# Patient Record
Sex: Female | Born: 1955
Health system: Southern US, Community
[De-identification: ages and names within clinical notes are randomized; demographics above are authoritative.]

## PROBLEM LIST (undated history)

## (undated) DIAGNOSIS — B019 Varicella without complication: Secondary | ICD-10-CM

## (undated) DIAGNOSIS — N390 Urinary tract infection, site not specified: Secondary | ICD-10-CM

## (undated) DIAGNOSIS — E785 Hyperlipidemia, unspecified: Secondary | ICD-10-CM

## (undated) DIAGNOSIS — R011 Cardiac murmur, unspecified: Secondary | ICD-10-CM

## (undated) DIAGNOSIS — R7611 Nonspecific reaction to tuberculin skin test without active tuberculosis: Secondary | ICD-10-CM

## (undated) DIAGNOSIS — I10 Essential (primary) hypertension: Secondary | ICD-10-CM

## (undated) DIAGNOSIS — K219 Gastro-esophageal reflux disease without esophagitis: Secondary | ICD-10-CM

## (undated) DIAGNOSIS — Z8614 Personal history of Methicillin resistant Staphylococcus aureus infection: Secondary | ICD-10-CM

## (undated) DIAGNOSIS — R32 Unspecified urinary incontinence: Secondary | ICD-10-CM

## (undated) DIAGNOSIS — T7840XA Allergy, unspecified, initial encounter: Secondary | ICD-10-CM

## (undated) DIAGNOSIS — E079 Disorder of thyroid, unspecified: Secondary | ICD-10-CM

## (undated) HISTORY — DX: Essential (primary) hypertension: I10

## (undated) HISTORY — DX: Disorder of thyroid, unspecified: E07.9

## (undated) HISTORY — DX: Varicella without complication: B01.9

## (undated) HISTORY — DX: Unspecified urinary incontinence: R32

## (undated) HISTORY — PX: TONSILLECTOMY: SUR1361

## (undated) HISTORY — PX: POLYPECTOMY: SHX149

## (undated) HISTORY — DX: Nonspecific reaction to tuberculin skin test without active tuberculosis: R76.11

## (undated) HISTORY — DX: Cardiac murmur, unspecified: R01.1

## (undated) HISTORY — DX: Allergy, unspecified, initial encounter: T78.40XA

## (undated) HISTORY — DX: Personal history of Methicillin resistant Staphylococcus aureus infection: Z86.14

## (undated) HISTORY — DX: Urinary tract infection, site not specified: N39.0

## (undated) HISTORY — DX: Gastro-esophageal reflux disease without esophagitis: K21.9

## (undated) HISTORY — DX: Hyperlipidemia, unspecified: E78.5

## (undated) HISTORY — PX: OTHER SURGICAL HISTORY: SHX169

---
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
## 2004-01-30 HISTORY — PX: COLONOSCOPY: SHX174

## 2004-01-30 LAB — HM COLONOSCOPY: HM Colonoscopy: NEGATIVE

## 2006-01-29 HISTORY — PX: MOLE REMOVAL: SHX2046

## 2011-01-31 ENCOUNTER — Telehealth: Payer: Self-pay | Admitting: Family Medicine

## 2011-01-31 NOTE — Telephone Encounter (Signed)
Pt informed she should check with her provider in [place] for refills.  She voiced her understanding

## 2011-01-31 NOTE — Telephone Encounter (Signed)
Pt is set up to est 03/12/11 but will be out of her Synthroid before then. Pt requesting a refill to get her to her appt. Please contact pt   [facility] [phone]

## 2011-03-12 ENCOUNTER — Encounter: Payer: Self-pay | Admitting: Family Medicine

## 2011-03-12 ENCOUNTER — Ambulatory Visit (INDEPENDENT_AMBULATORY_CARE_PROVIDER_SITE_OTHER): Payer: 59 | Admitting: Family Medicine

## 2011-03-12 VITALS — BP 138/84 | HR 80 | Temp 97.2°F | Resp 12 | Ht 60.0 in | Wt 143.0 lb

## 2011-03-12 DIAGNOSIS — Q210 Ventricular septal defect: Secondary | ICD-10-CM

## 2011-03-12 DIAGNOSIS — R7611 Nonspecific reaction to tuberculin skin test without active tuberculosis: Secondary | ICD-10-CM | POA: Insufficient documentation

## 2011-03-12 DIAGNOSIS — E039 Hypothyroidism, unspecified: Secondary | ICD-10-CM | POA: Insufficient documentation

## 2011-03-12 DIAGNOSIS — I10 Essential (primary) hypertension: Secondary | ICD-10-CM | POA: Insufficient documentation

## 2011-03-12 NOTE — Progress Notes (Signed)
  Subjective:    Patient ID: Brianna Jackson, female    DOB: 29-Aug-1955, 56 y.o.   MRN: [id number]  HPI  New to establish care. The patient recently moved here from [facility]. She is involved in nursing management. Past medical history reviewed. Positive PPD in childhood. Treated with INH. History of hypothyroidism treated with levothyroxine 100 mcg daily. Needs repeat lab work.  History of ventricular septal defect. Most recent echo 2010. Old records pending. Patient had episode of acute chest pain last fall and had nuclear stress test which was unremarkable. She has hypertension treated with lisinopril 20 mg daily. History of mild hyperlipidemia. Question of gluten sensitivity but no definitive diagnosis of celiac disease. Surgical history reviewed as outlined elsewhere  Patient is divorced. Never smoked. No alcohol use.  Last Pap smear 2011.  Colonoscopy 2006. Tetanus 2011  Past Medical History  Diagnosis Date  . Chicken pox   . Hypertension   . Hyperlipidemia   . Urine incontinence   . UTI (urinary tract infection)   . Heart murmur     VSD, ECHO 2010  . Thyroid disease     hypothyroid  . Positive TB test     treated with INH childhood  . History of MRSA infection 2010, 2012   Past Surgical History  Procedure Date  . Tonsillectomy   . Mrsa 2010, 2012    absess  . Strabismus surgery 1964    reports that she has never smoked. She does not have any smokeless tobacco history on file. Her alcohol and drug histories not on file. family history includes Alcohol abuse in her father; Cancer in her mother; and Cirrhosis in her father. Allergies  Allergen Reactions  . Wheat     Gluten intollerance      Review of Systems  Constitutional: Negative for fever, activity change and fatigue.  HENT: Negative for hearing loss, ear pain, sore throat and trouble swallowing.   Eyes: Negative for visual disturbance.  Respiratory: Negative for cough and shortness of breath.   Cardiovascular:  Negative for chest pain and palpitations.  Gastrointestinal: Negative for abdominal pain, diarrhea, constipation and blood in stool.  Genitourinary: Negative for dysuria and hematuria.  Musculoskeletal: Negative for myalgias, back pain and arthralgias.  Skin: Negative for rash.  Neurological: Negative for dizziness, syncope and headaches.  Hematological: Negative for adenopathy.  Psychiatric/Behavioral: Negative for confusion and dysphoric mood.       Objective:   Physical Exam  Constitutional: She is oriented to person, place, and time. She appears well-developed and well-nourished.  HENT:  Mouth/Throat: Oropharynx is clear and moist.  Neck: Neck supple. No thyromegaly present.  Cardiovascular: Normal rate and regular rhythm.   Murmur heard.      Patient has 4/6 systolic murmur left sternal border and right upper sternal border  Pulmonary/Chest: Effort normal and breath sounds normal. No respiratory distress. She has no wheezes. She has no rales.  Musculoskeletal: She exhibits no edema.  Lymphadenopathy:    She has no cervical adenopathy.  Neurological: She is alert and oriented to person, place, and time.          Assessment & Plan:  #1 hypothyroidism. Recheck TSH #2 hypertension. Stable  #3 heart murmur with reported history of VSD. Get old records  #4 history of reported positive PPD

## 2011-03-12 NOTE — Patient Instructions (Signed)
Consider complete physical at some point later this year. 

## 2011-03-13 ENCOUNTER — Other Ambulatory Visit: Payer: Self-pay | Admitting: Family Medicine

## 2011-03-13 MED ORDER — LEVOTHYROXINE SODIUM 100 MCG PO TABS: 100.0000 ug | ORAL_TABLET | Freq: Every day | ORAL | Status: DC

## 2011-03-13 NOTE — Progress Notes (Signed)
Quick Note:  Pt informed on home VM, med renewed ______

## 2011-03-26 ENCOUNTER — Telehealth: Payer: Self-pay | Admitting: Family Medicine

## 2011-03-26 MED ORDER — LISINOPRIL 20 MG PO TABS: 20.0000 mg | ORAL_TABLET | Freq: Every day | ORAL | Status: DC

## 2011-03-26 NOTE — Telephone Encounter (Signed)
Pt needs Lisinopril refill. Uses [facility].

## 2011-04-20 ENCOUNTER — Other Ambulatory Visit: Payer: Self-pay | Admitting: Family Medicine

## 2011-04-20 DIAGNOSIS — Z1231 Encounter for screening mammogram for malignant neoplasm of breast: Secondary | ICD-10-CM

## 2011-05-03 ENCOUNTER — Ambulatory Visit
Admission: RE | Admit: 2011-05-03 | Discharge: 2011-05-03 | Disposition: A | Discharge status: Home / Self Care | Payer: 59 | Source: Ambulatory Visit | Attending: Family Medicine | Admitting: Family Medicine

## 2011-05-03 DIAGNOSIS — Z1231 Encounter for screening mammogram for malignant neoplasm of breast: Secondary | ICD-10-CM

## 2011-11-02 ENCOUNTER — Telehealth: Payer: Self-pay | Admitting: Family Medicine

## 2011-11-02 ENCOUNTER — Ambulatory Visit (INDEPENDENT_AMBULATORY_CARE_PROVIDER_SITE_OTHER): Payer: 59 | Admitting: Internal Medicine

## 2011-11-02 VITALS — BP 120/86 | HR 80 | Temp 98.0°F | Wt 145.0 lb

## 2011-11-02 DIAGNOSIS — J209 Acute bronchitis, unspecified: Secondary | ICD-10-CM

## 2011-11-02 MED ORDER — DOXYCYCLINE HYCLATE 100 MG PO TABS: 100.0000 mg | ORAL_TABLET | Freq: Two times a day (BID) | ORAL | Status: DC

## 2011-11-02 MED ORDER — HYDROCODONE-HOMATROPINE 5-1.5 MG/5ML PO SYRP: 5.0000 mL | ORAL_SOLUTION | Freq: Two times a day (BID) | ORAL | Status: DC | PRN

## 2011-11-02 MED ORDER — MOMETASONE FURO-FORMOTEROL FUM 200-5 MCG/ACT IN AERO: 2.0000 | INHALATION_SPRAY | Freq: Two times a day (BID) | RESPIRATORY_TRACT | Status: DC

## 2011-11-02 NOTE — Patient Instructions (Addendum)
Please call our office if your symptoms do not improve or gets worse.  

## 2011-11-02 NOTE — Assessment & Plan Note (Signed)
56 year old white female with signs and symptoms of mild acute bronchitis. Treat with doxycycline 100 mg twice daily. Sample of [facility] provided. Patient to use Hycodan twice daily as needed for cough.  Patient advised to call office if symptoms persist or worsen.

## 2011-11-02 NOTE — Telephone Encounter (Signed)
Caller: Hendel/Patient; Patient Name: Brianna Jackson; PCP: Evelena Peat [facility]); Best Callback Phone Number: [phone]; Call regarding: Sinus Symptoms; onset 10/28/11 with cough and congestion; had some nausea and wheezing; denies nasal drainage; c/o HA at times; afebrile; using Coricidin; All emergent sxs of Cough-Adult protocol r/o except "recurring episodes of cough or hoarseness within last 3 months"; disp see within 72 hrs; informed Tiajuana that an appt would need to be made prior to an antibiotic being prescribed; appt scheduled with Dr.Yoo 10/4 at 1600

## 2011-11-02 NOTE — Progress Notes (Signed)
  Subjective:    Patient ID: Brianna Jackson, female    DOB: 01/03/1956, 56 y.o.   MRN: [id number]  HPI  56 year old white female with history of hypertension and hypothyroidism complains of cough x 1 week. Her symptoms started last Sunday. She reports her grandchildren had similar symptoms/upper respiratory infection. Patient reports her cough is settling into her chest. She describes productive cough. 2 days ago she had slight chills but no documented fever. She also reports intermittent wheezing. She denies shortness of breath.  Review of Systems See HPI    Past Medical History  Diagnosis Date  . Chicken pox   . Hypertension   . Hyperlipidemia   . Urine incontinence   . UTI (urinary tract infection)   . Heart murmur     VSD, ECHO 2010  . Thyroid disease     hypothyroid  . Positive TB test     treated with INH childhood  . History of MRSA infection 2010, 2012    History   Social History  . Marital Status: Divorced    Spouse Name: N/A    Number of Children: N/A  . Years of Education: N/A   Occupational History  . Not on file.   Social History Main Topics  . Smoking status: Never Smoker   . Smokeless tobacco: Not on file  . Alcohol Use: Not on file  . Drug Use: Not on file  . Sexually Active: Not on file   Other Topics Concern  . Not on file   Social History Narrative  . No narrative on file    Past Surgical History  Procedure Date  . Tonsillectomy   . Mrsa 2010, 2012    absess  . Strabismus surgery 1964    Family History  Problem Relation Age of Onset  . Cancer Mother     lung cancer  . Alcohol abuse Father   . Cirrhosis Father     Allergies  Allergen Reactions  . Wheat     Gluten intollerance    Current Outpatient Prescriptions on File Prior to Visit  Medication Sig Dispense Refill  . calcium carbonate (OS-CAL) 600 MG TABS Take 600 mg by mouth 2 (two) times daily with a meal.      . levothyroxine (SYNTHROID, LEVOTHROID) 100 MCG tablet Take 1  tablet (100 mcg total) by mouth daily.  90 tablet  3  . lisinopril (PRINIVIL,ZESTRIL) 20 MG tablet Take 1 tablet (20 mg total) by mouth daily.  90 tablet  3  . vitamin B-12 (CYANOCOBALAMIN) 100 MCG tablet Take 50 mcg by mouth daily.        BP 120/86  Pulse 80  Temp 98 F (36.7 C) (Oral)  Wt 145 lb (65.772 kg)      Objective:   Physical Exam  Constitutional: She is oriented to person, place, and time. She appears well-developed and well-nourished.  HENT:  Head: Normocephalic and atraumatic.  Right Ear: External ear normal.  Left Ear: External ear normal.  Mouth/Throat: Oropharynx is clear and moist.  Neck: Neck supple.       No neck tenderness  Cardiovascular: Normal rate and regular rhythm.   Murmur heard. Pulmonary/Chest: Effort normal.       Slight coarse breath sounds at right base  Lymphadenopathy:    She has no cervical adenopathy.  Neurological: She is alert and oriented to person, place, and time.          Assessment & Plan:

## 2011-11-12 ENCOUNTER — Encounter: Payer: 59 | Admitting: Family Medicine

## 2011-11-23 ENCOUNTER — Encounter: Payer: Self-pay | Admitting: Family Medicine

## 2011-11-23 ENCOUNTER — Other Ambulatory Visit (HOSPITAL_COMMUNITY)
Admission: RE | Admit: 2011-11-23 | Discharge: 2011-11-23 | Disposition: A | Discharge status: Home / Self Care | Payer: 59 | Source: Ambulatory Visit | Attending: Family Medicine | Admitting: Family Medicine

## 2011-11-23 ENCOUNTER — Ambulatory Visit (INDEPENDENT_AMBULATORY_CARE_PROVIDER_SITE_OTHER): Payer: 59 | Admitting: Family Medicine

## 2011-11-23 VITALS — BP 120/82 | HR 80 | Temp 98.3°F | Resp 12 | Ht 60.25 in | Wt 148.0 lb

## 2011-11-23 DIAGNOSIS — E039 Hypothyroidism, unspecified: Secondary | ICD-10-CM

## 2011-11-23 DIAGNOSIS — Q210 Ventricular septal defect: Secondary | ICD-10-CM

## 2011-11-23 DIAGNOSIS — I10 Essential (primary) hypertension: Secondary | ICD-10-CM

## 2011-11-23 DIAGNOSIS — Z01419 Encounter for gynecological examination (general) (routine) without abnormal findings: Secondary | ICD-10-CM | POA: Insufficient documentation

## 2011-11-23 DIAGNOSIS — Z0000 Encounter for general adult medical examination without abnormal findings: Secondary | ICD-10-CM

## 2011-11-23 LAB — BASIC METABOLIC PANEL
CO2: 28 mEq/L (ref 19–32)
Chloride: 106 mEq/L (ref 96–112)
Creatinine, Ser: 0.9 mg/dL (ref 0.4–1.2)
Sodium: 141 mEq/L (ref 135–145)

## 2011-11-23 LAB — TSH: TSH: 1.04 u[IU]/mL (ref 0.35–5.50)

## 2011-11-23 LAB — LIPID PANEL
HDL: 47.1 mg/dL (ref >39.00)
Total CHOL/HDL Ratio: 4
Triglycerides: 90 mg/dL (ref 0.0–149.0)

## 2011-11-23 LAB — HEPATIC FUNCTION PANEL
AST: 30 U/L (ref 0–37)
Alkaline Phosphatase: 84 U/L (ref 39–117)
Total Bilirubin: 0.6 mg/dL (ref 0.3–1.2)

## 2011-11-23 LAB — CBC WITH DIFFERENTIAL/PLATELET
Eosinophils Relative: 2.8 % (ref 0.0–5.0)
Monocytes Relative: 7.2 % (ref 3.0–12.0)
Neutrophils Relative %: 63.4 % (ref 43.0–77.0)
Platelets: 251 10*3/uL (ref 150.0–400.0)
WBC: 6.5 10*3/uL (ref 4.5–10.5)

## 2011-11-23 NOTE — Progress Notes (Signed)
  Subjective:    Patient ID: Brianna Jackson, female    DOB: Jun 05, 1955, 56 y.o.   MRN: [id number]  HPI  Patient here for complete physical. Her chronic problems include history of VSD, hypertension, hypothyroidism, positive PPD which has been treated. Recent bronchial illness which has gradually improved. Still occasional dry cough. Blood pressures have been well controlled. She's due for repeat Pap smear -now almost 3 years. Last mammogram last April. Tetanus up-to-date. Plans flu vaccine through work. Colonoscopy up to date.  Increased job stress and very little exercise. Has had some weight gain in the past year.  Past Medical History  Diagnosis Date  . Chicken pox   . Hypertension   . Hyperlipidemia   . Urine incontinence   . UTI (urinary tract infection)   . Heart murmur     VSD, ECHO 2010  . Thyroid disease     hypothyroid  . Positive TB test     treated with INH childhood  . History of MRSA infection 2010, 2012   Past Surgical History  Procedure Date  . Tonsillectomy   . Mrsa 2010, 2012    absess  . Strabismus surgery 1964    reports that she has never smoked. She does not have any smokeless tobacco history on file. Her alcohol and drug histories not on file. family history includes Alcohol abuse in her father; Cancer in her mother; and Cirrhosis in her father. Allergies  Allergen Reactions  . Wheat     Gluten intollerance      Review of Systems  Constitutional: Negative for fever, activity change, appetite change and fatigue.  HENT: Negative for hearing loss, ear pain, sore throat and trouble swallowing.   Eyes: Negative for visual disturbance.  Respiratory: Positive for cough. Negative for shortness of breath.   Cardiovascular: Negative for chest pain and palpitations.  Gastrointestinal: Negative for abdominal pain, diarrhea, constipation and blood in stool.  Genitourinary: Negative for dysuria and hematuria.  Musculoskeletal: Negative for myalgias, back pain  and arthralgias.  Skin: Negative for rash.  Neurological: Negative for dizziness, syncope and headaches.  Hematological: Negative for adenopathy.  Psychiatric/Behavioral: Negative for confusion and dysphoric mood.       Objective:   Physical Exam  Constitutional: She is oriented to person, place, and time. She appears well-developed and well-nourished.  HENT:  Head: Normocephalic and atraumatic.  Eyes: EOM are normal. Pupils are equal, round, and reactive to light.  Neck: Normal range of motion. Neck supple. No thyromegaly present.  Cardiovascular: Normal rate, regular rhythm and normal heart sounds.   No murmur heard. Pulmonary/Chest: Breath sounds normal. No respiratory distress. She has no wheezes. She has no rales.  Abdominal: Soft. Bowel sounds are normal. She exhibits no distension and no mass. There is no tenderness. There is no rebound and no guarding.  Musculoskeletal: Normal range of motion. She exhibits no edema.  Lymphadenopathy:    She has no cervical adenopathy.  Neurological: She is alert and oriented to person, place, and time. She displays normal reflexes. No cranial nerve deficit.  Skin: No rash noted.  Psychiatric: She has a normal mood and affect. Her behavior is normal. Judgment and thought content normal.          Assessment & Plan:  Complete physical. Pap smear obtained. Continue yearly mammogram. Continue yearly flu vaccine. Colonoscopy up to date. Obtain screening lab work.

## 2011-11-30 NOTE — Progress Notes (Signed)
Quick Note:  Pt informed ______ 

## 2012-03-15 ENCOUNTER — Other Ambulatory Visit: Payer: Self-pay

## 2012-03-18 ENCOUNTER — Other Ambulatory Visit: Payer: Self-pay | Admitting: *Deleted

## 2012-03-18 MED ORDER — LEVOTHYROXINE SODIUM 100 MCG PO TABS: 100.0000 ug | ORAL_TABLET | Freq: Every day | ORAL | Status: DC

## 2012-04-02 ENCOUNTER — Other Ambulatory Visit: Payer: Self-pay

## 2012-04-02 DIAGNOSIS — Z1231 Encounter for screening mammogram for malignant neoplasm of breast: Secondary | ICD-10-CM

## 2012-04-14 ENCOUNTER — Other Ambulatory Visit: Payer: Self-pay | Admitting: *Deleted

## 2012-04-14 MED ORDER — LISINOPRIL 20 MG PO TABS: 20.0000 mg | ORAL_TABLET | Freq: Every day | ORAL | Status: DC

## 2012-05-05 ENCOUNTER — Ambulatory Visit
Admission: RE | Admit: 2012-05-05 | Discharge: 2012-05-05 | Disposition: A | Discharge status: Home / Self Care | Payer: 59 | Source: Ambulatory Visit

## 2012-05-05 DIAGNOSIS — Z1231 Encounter for screening mammogram for malignant neoplasm of breast: Secondary | ICD-10-CM

## 2012-06-18 ENCOUNTER — Ambulatory Visit (INDEPENDENT_AMBULATORY_CARE_PROVIDER_SITE_OTHER): Payer: 59 | Admitting: Family Medicine

## 2012-06-18 ENCOUNTER — Encounter: Payer: Self-pay | Admitting: Family Medicine

## 2012-06-18 VITALS — BP 110/82 | Temp 97.6°F

## 2012-06-18 DIAGNOSIS — R300 Dysuria: Secondary | ICD-10-CM

## 2012-06-18 DIAGNOSIS — N390 Urinary tract infection, site not specified: Secondary | ICD-10-CM

## 2012-06-18 LAB — POCT URINALYSIS DIPSTICK
Bilirubin, UA: NEGATIVE
Glucose, UA: NEGATIVE
Nitrite, UA: NEGATIVE
Spec Grav, UA: 1.02

## 2012-06-18 MED ORDER — MUPIROCIN 2 % EX OINT: TOPICAL_OINTMENT | Freq: Three times a day (TID) | CUTANEOUS | Status: DC

## 2012-06-18 MED ORDER — CIPROFLOXACIN HCL 500 MG PO TABS: 500.0000 mg | ORAL_TABLET | Freq: Two times a day (BID) | ORAL | Status: DC

## 2012-06-18 NOTE — Patient Instructions (Signed)
Urinary Tract Infection  Urinary tract infections (UTIs) can develop anywhere along your urinary tract. Your urinary tract is your body's drainage system for removing wastes and extra water. Your urinary tract includes two kidneys, two ureters, a bladder, and a urethra. Your kidneys are a pair of bean-shaped organs. Each kidney is about the size of your fist. They are located below your ribs, one on each side of your spine.  CAUSES  Infections are caused by microbes, which are microscopic organisms, including fungi, viruses, and bacteria. These organisms are so small that they can only be seen through a microscope. Bacteria are the microbes that most commonly cause UTIs.  SYMPTOMS   Symptoms of UTIs may vary by age and gender of the patient and by the location of the infection. Symptoms in young women typically include a frequent and intense urge to urinate and a painful, burning feeling in the bladder or urethra during urination. Older women and men are more likely to be tired, shaky, and weak and have muscle aches and abdominal pain. A fever may mean the infection is in your kidneys. Other symptoms of a kidney infection include pain in your back or sides below the ribs, nausea, and vomiting.  DIAGNOSIS  To diagnose a UTI, your caregiver will ask you about your symptoms. Your caregiver also will ask to provide a urine sample. The urine sample will be tested for bacteria and white blood cells. White blood cells are made by your body to help fight infection.  TREATMENT   Typically, UTIs can be treated with medication. Because most UTIs are caused by a bacterial infection, they usually can be treated with the use of antibiotics. The choice of antibiotic and length of treatment depend on your symptoms and the type of bacteria causing your infection.  HOME CARE INSTRUCTIONS   If you were prescribed antibiotics, take them exactly as your caregiver instructs you. Finish the medication even if you feel better after you  have only taken some of the medication.   Drink enough water and fluids to keep your urine clear or pale yellow.   Avoid caffeine, tea, and carbonated beverages. They tend to irritate your bladder.   Empty your bladder often. Avoid holding urine for long periods of time.   Empty your bladder before and after sexual intercourse.   After a bowel movement, women should cleanse from front to back. Use each tissue only once.  SEEK MEDICAL CARE IF:    You have back pain.   You develop a fever.   Your symptoms do not begin to resolve within 3 days.  SEEK IMMEDIATE MEDICAL CARE IF:    You have severe back pain or lower abdominal pain.   You develop chills.   You have nausea or vomiting.   You have continued burning or discomfort with urination.  MAKE SURE YOU:    Understand these instructions.   Will watch your condition.   Will get help right away if you are not doing well or get worse.  Document Released: 10/25/2004 Document Revised: 07/17/2011 Document Reviewed: 02/23/2011  [employer] Patient Information 2014 [employer].

## 2012-06-18 NOTE — Progress Notes (Signed)
  Subjective:    Patient ID: Brianna Jackson, female    DOB: [birth year], 57 y.o.   MRN: [id number]  HPI Acute visit. 2 day history of some urinary urgency and suprapubic pressure. No real burning with urination. Some increased malaise. Question low-grade fever last night but not confirmed. Denies any nausea or vomiting. Good fluid intake.  Patient also relates prior history of MRSA. Requesting Bactroban ointment which she uses periodically intranasal. No recent skin infections.  Past Medical History  Diagnosis Date  . Chicken pox   . Hypertension   . Hyperlipidemia   . Urine incontinence   . UTI (urinary tract infection)   . Heart murmur     VSD, ECHO 2010  . Thyroid disease     hypothyroid  . Positive TB test     treated with INH childhood  . History of MRSA infection 2010, 2012   Past Surgical History  Procedure Laterality Date  . Tonsillectomy    . Mrsa  2010, 2012    absess  . Strabismus surgery  1964    reports that she has never smoked. She does not have any smokeless tobacco history on file. Her alcohol and drug histories are not on file. family history includes Alcohol abuse in her father; Cancer in her mother; and Cirrhosis in her father. Allergies  Allergen Reactions  . Wheat     Gluten intollerance      Review of Systems  Constitutional: Negative for fever, chills and appetite change.  Gastrointestinal: Negative for nausea, vomiting, abdominal pain, diarrhea and constipation.  Genitourinary: Positive for dysuria and frequency.  Musculoskeletal: Negative for back pain.  Neurological: Negative for dizziness.       Objective:   Physical Exam  Constitutional: She appears well-developed and well-nourished.  Cardiovascular: Normal rate and regular rhythm.   Pulmonary/Chest: Effort normal and breath sounds normal. No respiratory distress. She has no wheezes. She has no rales.  Musculoskeletal:  No flank tenderness          Assessment & Plan:  UTI. Start  Cipro 500 mg twice daily for 3 days. Urine culture sent.

## 2012-06-20 ENCOUNTER — Encounter: Payer: Self-pay | Admitting: Family Medicine

## 2012-06-20 LAB — URINE CULTURE: Colony Count: >=100000

## 2012-12-04 ENCOUNTER — Other Ambulatory Visit: Payer: Self-pay

## 2013-03-24 ENCOUNTER — Other Ambulatory Visit: Payer: Self-pay | Admitting: Family Medicine

## 2013-03-25 ENCOUNTER — Other Ambulatory Visit: Payer: Self-pay | Admitting: Family Medicine

## 2013-04-20 ENCOUNTER — Other Ambulatory Visit: Payer: Self-pay

## 2013-04-20 DIAGNOSIS — Z1231 Encounter for screening mammogram for malignant neoplasm of breast: Secondary | ICD-10-CM

## 2013-04-29 ENCOUNTER — Other Ambulatory Visit (INDEPENDENT_AMBULATORY_CARE_PROVIDER_SITE_OTHER): Payer: 59

## 2013-04-29 DIAGNOSIS — Z0000 Encounter for general adult medical examination without abnormal findings: Secondary | ICD-10-CM

## 2013-04-29 LAB — BASIC METABOLIC PANEL
BUN: 17 mg/dL (ref 6–23)
CALCIUM: 9.6 mg/dL (ref 8.4–10.5)
CO2: 27 meq/L (ref 19–32)
Chloride: 107 mEq/L (ref 96–112)
Creatinine, Ser: 1 mg/dL (ref 0.4–1.2)
GFR: 58.3 mL/min — AB (ref >60.00)
GLUCOSE: 104 mg/dL — AB (ref 70–99)
Potassium: 4.8 mEq/L (ref 3.5–5.1)
SODIUM: 141 meq/L (ref 135–145)

## 2013-04-29 LAB — HEPATIC FUNCTION PANEL
ALBUMIN: 4.1 g/dL (ref 3.5–5.2)
ALT: 32 U/L (ref 0–35)
AST: 30 U/L (ref 0–37)
Alkaline Phosphatase: 79 U/L (ref 39–117)
BILIRUBIN TOTAL: 0.6 mg/dL (ref 0.3–1.2)
Bilirubin, Direct: 0 mg/dL (ref 0.0–0.3)
Total Protein: 7 g/dL (ref 6.0–8.3)

## 2013-04-29 LAB — LIPID PANEL
CHOL/HDL RATIO: 4
CHOLESTEROL: 192 mg/dL (ref 0–200)
HDL: 50.6 mg/dL (ref >39.00)
LDL Cholesterol: 126 mg/dL — ABNORMAL HIGH (ref 0–99)
TRIGLYCERIDES: 78 mg/dL (ref 0.0–149.0)
VLDL: 15.6 mg/dL (ref 0.0–40.0)

## 2013-04-29 LAB — CBC WITH DIFFERENTIAL/PLATELET
BASOS ABS: 0 10*3/uL (ref 0.0–0.1)
Basophils Relative: 0.5 % (ref 0.0–3.0)
EOS ABS: 0.2 10*3/uL (ref 0.0–0.7)
Eosinophils Relative: 2.7 % (ref 0.0–5.0)
HEMATOCRIT: 39.4 % (ref 36.0–46.0)
HEMOGLOBIN: 13.4 g/dL (ref 12.0–15.0)
LYMPHS ABS: 1.6 10*3/uL (ref 0.7–4.0)
LYMPHS PCT: 22.9 % (ref 12.0–46.0)
MCHC: 34.1 g/dL (ref 30.0–36.0)
MCV: 85.4 fl (ref 78.0–100.0)
Monocytes Absolute: 0.5 10*3/uL (ref 0.1–1.0)
Monocytes Relative: 6.9 % (ref 3.0–12.0)
NEUTROS ABS: 4.7 10*3/uL (ref 1.4–7.7)
Neutrophils Relative %: 67 % (ref 43.0–77.0)
PLATELETS: 308 10*3/uL (ref 150.0–400.0)
RBC: 4.61 Mil/uL (ref 3.87–5.11)
RDW: 13.9 % (ref 11.5–14.6)
WBC: 7.1 10*3/uL (ref 4.5–10.5)

## 2013-04-29 LAB — POCT URINALYSIS DIPSTICK
BILIRUBIN UA: NEGATIVE
GLUCOSE UA: NEGATIVE
Ketones, UA: NEGATIVE
NITRITE UA: NEGATIVE
Protein, UA: NEGATIVE
Spec Grav, UA: 1.025
UROBILINOGEN UA: 0.2
pH, UA: 5.5

## 2013-04-29 LAB — TSH: TSH: 1.92 u[IU]/mL (ref 0.35–5.50)

## 2013-05-06 ENCOUNTER — Ambulatory Visit
Admission: RE | Admit: 2013-05-06 | Discharge: 2013-05-06 | Disposition: A | Discharge status: Home / Self Care | Payer: 59 | Source: Ambulatory Visit

## 2013-05-06 DIAGNOSIS — Z1231 Encounter for screening mammogram for malignant neoplasm of breast: Secondary | ICD-10-CM

## 2013-05-11 ENCOUNTER — Ambulatory Visit (INDEPENDENT_AMBULATORY_CARE_PROVIDER_SITE_OTHER): Payer: 59 | Admitting: Family Medicine

## 2013-05-11 ENCOUNTER — Encounter: Payer: Self-pay | Admitting: Family Medicine

## 2013-05-11 VITALS — BP 120/80 | HR 78 | Temp 98.0°F | Ht 60.0 in | Wt 159.0 lb

## 2013-05-11 DIAGNOSIS — Z0000 Encounter for general adult medical examination without abnormal findings: Secondary | ICD-10-CM

## 2013-05-11 DIAGNOSIS — E669 Obesity, unspecified: Secondary | ICD-10-CM | POA: Insufficient documentation

## 2013-05-11 DIAGNOSIS — R319 Hematuria, unspecified: Secondary | ICD-10-CM

## 2013-05-11 LAB — POCT URINALYSIS DIPSTICK
BILIRUBIN UA: NEGATIVE
GLUCOSE UA: NEGATIVE
KETONES UA: NEGATIVE
NITRITE UA: NEGATIVE
Protein, UA: NEGATIVE
SPEC GRAV UA: 1.02
Urobilinogen, UA: 0.2
pH, UA: 7

## 2013-05-11 LAB — URINALYSIS, MICROSCOPIC ONLY

## 2013-05-11 NOTE — Progress Notes (Signed)
Pre visit review using our clinic review tool, if applicable. No additional management support is needed unless otherwise documented below in the visit note. 

## 2013-05-11 NOTE — Patient Instructions (Signed)
Continue yearly mammogram Continue yearly flu vaccine Consider shingles vaccine next year (at age 58) Repeat colonoscopy by next year Repeat Pap smear by next year

## 2013-05-11 NOTE — Progress Notes (Signed)
   Subjective:    Patient ID: Brianna Jackson, female    DOB: 1955/11/25, 58 y.o.   MRN: [id number]  HPI Patient is here for complete physical. Her chronic problems include history of hypertension and hypothyroidism. Medications reviewed and compliant with all. She has history of VSD . She's had previous positive PPD which was treated.  this past year had some weight gain and less compliance with exercise. She has never smoked. Colonoscopy 9 years ago normal. Tetanus up-to-date  Patient had normal mammogram just a few days ago. She had normal Pap smear one and one half years ago and is low risk.  Past Medical History  Diagnosis Date  . Chicken pox   . Hypertension   . Hyperlipidemia   . Urine incontinence   . UTI (urinary tract infection)   . Heart murmur     VSD, ECHO 2010  . Thyroid disease     hypothyroid  . Positive TB test     treated with INH childhood  . History of MRSA infection 2010, 2012   Past Surgical History  Procedure Laterality Date  . Tonsillectomy    . Mrsa  2010, 2012    absess  . Strabismus surgery  1964    reports that she has never smoked. She does not have any smokeless tobacco history on file. Her alcohol and drug histories are not on file. family history includes Alcohol abuse in her father; Cancer in her mother; Cirrhosis in her father. Allergies  Allergen Reactions  . Wheat     Gluten intollerance      Review of Systems  Constitutional: Negative for fever, activity change, appetite change, fatigue and unexpected weight change.  HENT: Negative for ear pain, hearing loss, sore throat and trouble swallowing.   Eyes: Negative for visual disturbance.  Respiratory: Negative for cough and shortness of breath.   Cardiovascular: Negative for chest pain and palpitations.  Gastrointestinal: Negative for abdominal pain, diarrhea, constipation and blood in stool.  Endocrine: Negative for polydipsia and polyuria.  Genitourinary: Negative for dysuria and  hematuria.  Musculoskeletal: Negative for arthralgias, back pain and myalgias.  Skin: Negative for rash.  Neurological: Negative for dizziness, syncope and headaches.  Hematological: Negative for adenopathy.  Psychiatric/Behavioral: Negative for confusion and dysphoric mood.       Objective:   Physical Exam  Constitutional: She is oriented to person, place, and time. She appears well-developed and well-nourished. No distress.  HENT:  Right Ear: External ear normal.  Left Ear: External ear normal.  Mouth/Throat: Oropharynx is clear and moist.  Neck: Neck supple. No thyromegaly present.  Cardiovascular: Normal rate and regular rhythm.   Pulmonary/Chest: Effort normal and breath sounds normal. No respiratory distress. She has no wheezes. She has no rales.  Musculoskeletal: She exhibits no edema.  Lymphadenopathy:    She has no cervical adenopathy.  Neurological: She is alert and oriented to person, place, and time.  Psychiatric: She has a normal mood and affect. Her behavior is normal.          Assessment & Plan:  Complete physical. Patient has 1+ blood on urine dipstick. Reflux to urine micro-. Repeat Pap smear next year. Repeat colonoscopy the next year. Work on weight loss. Establish more consistent exercise.

## 2013-06-23 ENCOUNTER — Other Ambulatory Visit: Payer: Self-pay | Admitting: Family Medicine

## 2013-09-29 ENCOUNTER — Other Ambulatory Visit: Payer: Self-pay | Admitting: Family Medicine

## 2013-10-14 ENCOUNTER — Ambulatory Visit (INDEPENDENT_AMBULATORY_CARE_PROVIDER_SITE_OTHER): Payer: 59 | Admitting: Family Medicine

## 2013-10-14 ENCOUNTER — Encounter: Payer: Self-pay | Admitting: Family Medicine

## 2013-10-14 VITALS — BP 120/90 | HR 80 | Temp 97.5°F | Wt 162.0 lb

## 2013-10-14 DIAGNOSIS — J209 Acute bronchitis, unspecified: Secondary | ICD-10-CM

## 2013-10-14 MED ORDER — ALBUTEROL SULFATE HFA 108 (90 BASE) MCG/ACT IN AERS: 1.0000 | INHALATION_SPRAY | RESPIRATORY_TRACT | Status: DC | PRN

## 2013-10-14 NOTE — Progress Notes (Signed)
Pre visit review using our clinic review tool, if applicable. No additional management support is needed unless otherwise documented below in the visit note. 

## 2013-10-14 NOTE — Patient Instructions (Signed)

## 2013-10-14 NOTE — Progress Notes (Signed)
   Subjective:    Patient ID: Brianna Jackson, female    DOB: [birth year], 58 y.o.   MRN: [id number]  Cough Pertinent negatives include no chills, fever or headaches.   Patient seen for acute visit. Nonsmoker who presents with approximately three-day history of cough. She had some initial sore throat. No fever. She's had some diffuse body aches. She was around grandchildren this weekend had similar symptoms. No nasal congestion. She had some wheezing slight off and on. No history of asthma. Previously took [place]. for similar type episode last winter.  Past Medical History  Diagnosis Date  . Chicken pox   . Hypertension   . Hyperlipidemia   . Urine incontinence   . UTI (urinary tract infection)   . Heart murmur     VSD, ECHO 2010  . Thyroid disease     hypothyroid  . Positive TB test     treated with INH childhood  . History of MRSA infection 2010, 2012   Past Surgical History  Procedure Laterality Date  . Tonsillectomy    . Mrsa  2010, 2012    absess  . Strabismus surgery  1964    reports that she has never smoked. She does not have any smokeless tobacco history on file. Her alcohol and drug histories are not on file. family history includes Alcohol abuse in her father; Cancer in her mother; Cirrhosis in her father. Allergies  Allergen Reactions  . Wheat     Gluten intollerance      Review of Systems  Constitutional: Negative for fever and chills.  HENT: Negative for congestion.   Respiratory: Positive for cough.   Neurological: Negative for headaches.       Objective:   Physical Exam  Constitutional: She appears well-developed and well-nourished.  HENT:  Right Ear: External ear normal.  Left Ear: External ear normal.  Mouth/Throat: Oropharynx is clear and moist.  Neck: Neck supple.  Cardiovascular: Normal rate and regular rhythm.   Murmur heard. Pulmonary/Chest: Effort normal and breath sounds normal. No respiratory distress. She has no wheezes. She has no  rales.  Lymphadenopathy:    She has no cervical adenopathy.          Assessment & Plan:  Cough. Suspect acute viral bronchitis. Possible intermittent reactive airway issues. No active wheezing at this time. Proventil inhaler 2 puffs every 4 hours as needed for cough and wheeze. No indication for antibiotic at this time. Follow up for fever or worsening symptoms

## 2013-10-23 ENCOUNTER — Telehealth: Payer: Self-pay | Admitting: Family Medicine

## 2013-10-23 ENCOUNTER — Ambulatory Visit (INDEPENDENT_AMBULATORY_CARE_PROVIDER_SITE_OTHER): Payer: 59 | Admitting: Family Medicine

## 2013-10-23 ENCOUNTER — Ambulatory Visit (INDEPENDENT_AMBULATORY_CARE_PROVIDER_SITE_OTHER)
Admission: RE | Admit: 2013-10-23 | Discharge: 2013-10-23 | Disposition: A | Discharge status: Home / Self Care | Payer: 59 | Source: Ambulatory Visit | Attending: Family Medicine | Admitting: Family Medicine

## 2013-10-23 ENCOUNTER — Encounter: Payer: Self-pay | Admitting: Family Medicine

## 2013-10-23 VITALS — BP 122/80 | HR 76 | Temp 97.7°F | Wt 157.0 lb

## 2013-10-23 DIAGNOSIS — R05 Cough: Secondary | ICD-10-CM

## 2013-10-23 DIAGNOSIS — R059 Cough, unspecified: Secondary | ICD-10-CM

## 2013-10-23 MED ORDER — PREDNISONE 10 MG PO TABS: ORAL_TABLET | ORAL | Status: DC

## 2013-10-23 MED ORDER — LEVOFLOXACIN 750 MG PO TABS: 750.0000 mg | ORAL_TABLET | Freq: Every day | ORAL | Status: DC

## 2013-10-23 NOTE — Progress Notes (Signed)
   Subjective:    Patient ID: Brianna Jackson, female    DOB: 1955/04/23, 58 y.o.   MRN: [id number]  Cough Pertinent negatives include no chills or fever.   Persistent cough and wheezing. Refer to prior note. Her cough is occasionally productive of yellow sputum. She's had some persistent rhinorrhea and headaches. No fever. She has some home-going wheezing and has taken albuterol which helps slightly. She's never smoked. No hemoptysis.  Past Medical History  Diagnosis Date  . Chicken pox   . Hypertension   . Hyperlipidemia   . Urine incontinence   . UTI (urinary tract infection)   . Heart murmur     VSD, ECHO 2010  . Thyroid disease     hypothyroid  . Positive TB test     treated with INH childhood  . History of MRSA infection 2010, 2012   Past Surgical History  Procedure Laterality Date  . Tonsillectomy    . Mrsa  2010, 2012    absess  . Strabismus surgery  1964    reports that she has never smoked. She does not have any smokeless tobacco history on file. Her alcohol and drug histories are not on file. family history includes Alcohol abuse in her father; Cancer in her mother; Cirrhosis in her father. Allergies  Allergen Reactions  . Wheat     Gluten intollerance      Review of Systems  Constitutional: Positive for fatigue. Negative for fever and chills.  HENT: Positive for congestion.   Respiratory: Positive for cough.        Objective:   Physical Exam  Constitutional: She appears well-developed and well-nourished.  Cardiovascular: Normal rate and regular rhythm.   Pulmonary/Chest:  Patient has some crackles and rales left anterior chest and to lesser extent left posterior chest. Right side is clear. She has some faint wheezes. No retractions  Musculoskeletal: She exhibits no edema.          Assessment & Plan:  Persistent cough now presenting with asymmetric lung exam with left lung findings as above. No respiratory distress. Pulse oximetry 96%. Sent for  chest x-ray. Start Levaquin 750 mg once daily for 7 days. Prednisone taper. Continue albuterol inhaler as needed.

## 2013-10-23 NOTE — Telephone Encounter (Signed)
Noted  

## 2013-10-23 NOTE — Progress Notes (Signed)
Pre visit review using our clinic review tool, if applicable. No additional management support is needed unless otherwise documented below in the visit note. 

## 2013-10-23 NOTE — Telephone Encounter (Signed)
Patient Information:  Caller Name: Joel  Phone: [phone]  Patient: Brianna, Jackson  Gender: Female  DOB: 05/31/1955  Age: 58 Years  PCP: Carolann Littler [facility])  Office Follow Up:  Does the office need to follow up with this patient?: No  Instructions For The Office: N/A  RN Note:  Will obtain appt. with Dr. Elease Hashimoto today for a re-evaluation and worsening of symptoms.  Symptoms  Reason For Call & Symptoms: Went into the office to see Dr. Elease Hashimoto on 10/14/13. Diagnosed with viral bronchitis. Pt. is not feeling much better. Coughing a lot and wheezing. No fever. Was given Albuterol inhaler. Taking OTC decongestant.  Reviewed Health History In EMR: Yes  Reviewed Medications In EMR: Yes  Reviewed Allergies In EMR: Yes  Reviewed Surgeries / Procedures: Yes  Date of Onset of Symptoms: 10/14/2013  Treatments Tried: Albuterol  Treatments Tried Worked: Yes  Guideline(s) Used:  Cough  Disposition Per Guideline:   Go to Office Now  Reason For Disposition Reached:   Wheezing is present  Advice Given:  Call Back If:  Difficulty breathing  Cough lasts more than 3 weeks  You become worse.  Patient Will Follow Care Advice:  YES  Appointment Scheduled:  10/23/2013 15:30:00 Appointment Scheduled Provider:  Carolann Littler [facility])

## 2014-01-15 ENCOUNTER — Telehealth: Payer: Self-pay | Admitting: Family Medicine

## 2014-01-15 NOTE — Telephone Encounter (Signed)
Updated pt immunization record

## 2014-01-15 NOTE — Telephone Encounter (Signed)
Patient called stating she received her Flu shot in September in her office, she works for [employer].

## 2014-04-12 ENCOUNTER — Other Ambulatory Visit: Payer: Self-pay | Admitting: Family Medicine

## 2014-06-22 ENCOUNTER — Other Ambulatory Visit: Payer: Self-pay

## 2014-06-22 DIAGNOSIS — Z1231 Encounter for screening mammogram for malignant neoplasm of breast: Secondary | ICD-10-CM

## 2014-07-05 ENCOUNTER — Other Ambulatory Visit: Payer: Self-pay | Admitting: *Deleted

## 2014-07-05 MED ORDER — LEVOTHYROXINE SODIUM 100 MCG PO TABS: 100.0000 ug | ORAL_TABLET | Freq: Every day | ORAL | Status: DC

## 2014-07-19 ENCOUNTER — Ambulatory Visit
Admission: RE | Admit: 2014-07-19 | Discharge: 2014-07-19 | Disposition: A | Discharge status: Home / Self Care | Payer: 59 | Source: Ambulatory Visit

## 2014-07-19 DIAGNOSIS — Z1231 Encounter for screening mammogram for malignant neoplasm of breast: Secondary | ICD-10-CM

## 2014-10-13 ENCOUNTER — Telehealth: Payer: Self-pay | Admitting: Family Medicine

## 2014-10-13 MED ORDER — LEVOTHYROXINE SODIUM 100 MCG PO TABS: 100.0000 ug | ORAL_TABLET | Freq: Every day | ORAL | Status: DC

## 2014-10-13 NOTE — Telephone Encounter (Signed)
Rx sent to pharmacy   

## 2014-10-13 NOTE — Telephone Encounter (Signed)
Pt request refill levothyroxine (SYNTHROID, LEVOTHROID) 100 MCG tablet  Pt has appt on 9/20 for her cpe. Can you send in enough to get her through?  [facility]/ [address]

## 2014-10-14 ENCOUNTER — Other Ambulatory Visit (INDEPENDENT_AMBULATORY_CARE_PROVIDER_SITE_OTHER): Payer: 59

## 2014-10-14 DIAGNOSIS — Z0000 Encounter for general adult medical examination without abnormal findings: Secondary | ICD-10-CM | POA: Diagnosis not present

## 2014-10-14 LAB — CBC WITH DIFFERENTIAL/PLATELET
BASOS ABS: 0 10*3/uL (ref 0.0–0.1)
Basophils Relative: 0.7 % (ref 0.0–3.0)
EOS ABS: 0.2 10*3/uL (ref 0.0–0.7)
Eosinophils Relative: 3.6 % (ref 0.0–5.0)
HCT: 39.9 % (ref 36.0–46.0)
Hemoglobin: 13.7 g/dL (ref 12.0–15.0)
LYMPHS ABS: 1.5 10*3/uL (ref 0.7–4.0)
Lymphocytes Relative: 24.9 % (ref 12.0–46.0)
MCHC: 34.5 g/dL (ref 30.0–36.0)
MCV: 84.4 fl (ref 78.0–100.0)
Monocytes Absolute: 0.5 10*3/uL (ref 0.1–1.0)
Monocytes Relative: 7.8 % (ref 3.0–12.0)
NEUTROS ABS: 3.9 10*3/uL (ref 1.4–7.7)
NEUTROS PCT: 63 % (ref 43.0–77.0)
PLATELETS: 258 10*3/uL (ref 150.0–400.0)
RBC: 4.72 Mil/uL (ref 3.87–5.11)
RDW: 14.2 % (ref 11.5–15.5)
WBC: 6.2 10*3/uL (ref 4.0–10.5)

## 2014-10-14 LAB — LIPID PANEL
CHOL/HDL RATIO: 3
CHOLESTEROL: 170 mg/dL (ref 0–200)
HDL: 49 mg/dL (ref >39.00)
LDL Cholesterol: 107 mg/dL — ABNORMAL HIGH (ref 0–99)
NonHDL: 120.96
TRIGLYCERIDES: 72 mg/dL (ref 0.0–149.0)
VLDL: 14.4 mg/dL (ref 0.0–40.0)

## 2014-10-14 LAB — BASIC METABOLIC PANEL
BUN: 16 mg/dL (ref 6–23)
CALCIUM: 9.4 mg/dL (ref 8.4–10.5)
CO2: 30 meq/L (ref 19–32)
CREATININE: 0.88 mg/dL (ref 0.40–1.20)
Chloride: 108 mEq/L (ref 96–112)
GFR: 69.56 mL/min (ref >60.00)
GLUCOSE: 92 mg/dL (ref 70–99)
Potassium: 4.8 mEq/L (ref 3.5–5.1)
Sodium: 142 mEq/L (ref 135–145)

## 2014-10-14 LAB — TSH: TSH: 0.6 u[IU]/mL (ref 0.35–4.50)

## 2014-10-14 LAB — HEPATIC FUNCTION PANEL
ALBUMIN: 4 g/dL (ref 3.5–5.2)
ALK PHOS: 75 U/L (ref 39–117)
ALT: 46 U/L — ABNORMAL HIGH (ref 0–35)
AST: 37 U/L (ref 0–37)
BILIRUBIN DIRECT: 0.1 mg/dL (ref 0.0–0.3)
Total Bilirubin: 0.6 mg/dL (ref 0.2–1.2)
Total Protein: 6.7 g/dL (ref 6.0–8.3)

## 2014-10-19 ENCOUNTER — Other Ambulatory Visit (HOSPITAL_COMMUNITY)
Admission: RE | Admit: 2014-10-19 | Discharge: 2014-10-19 | Disposition: A | Discharge status: Home / Self Care | Payer: 59 | Source: Ambulatory Visit | Attending: Family Medicine | Admitting: Family Medicine

## 2014-10-19 ENCOUNTER — Telehealth: Payer: Self-pay | Admitting: Internal Medicine

## 2014-10-19 ENCOUNTER — Encounter: Payer: Self-pay | Admitting: Family Medicine

## 2014-10-19 ENCOUNTER — Ambulatory Visit (INDEPENDENT_AMBULATORY_CARE_PROVIDER_SITE_OTHER): Payer: 59 | Admitting: Family Medicine

## 2014-10-19 VITALS — BP 130/90 | HR 80 | Temp 97.5°F | Ht 60.0 in | Wt 161.7 lb

## 2014-10-19 DIAGNOSIS — R740 Nonspecific elevation of levels of transaminase and lactic acid dehydrogenase [LDH]: Secondary | ICD-10-CM

## 2014-10-19 DIAGNOSIS — Z0000 Encounter for general adult medical examination without abnormal findings: Secondary | ICD-10-CM | POA: Diagnosis not present

## 2014-10-19 DIAGNOSIS — Z01419 Encounter for gynecological examination (general) (routine) without abnormal findings: Secondary | ICD-10-CM | POA: Diagnosis not present

## 2014-10-19 DIAGNOSIS — R7401 Elevation of levels of liver transaminase levels: Secondary | ICD-10-CM

## 2014-10-19 NOTE — Patient Instructions (Signed)
Check on coverage for shingles vaccine. Work on [employer] loss Plan follow-up labs-hepatic panel-in about 3 months

## 2014-10-19 NOTE — Progress Notes (Signed)
Pre visit review using our clinic review tool, if applicable. No additional management support is needed unless otherwise documented below in the visit note. 

## 2014-10-19 NOTE — Progress Notes (Signed)
Subjective:    Patient ID: Brianna Jackson, female    DOB: Jun 10, 1955, 59 y.o.   MRN: [id number]  HPI   Patient here for complete physical. Last Pap smear about 2 years ago. No history of abnormalities. She is due for repeat colonoscopy. No history of shingles vaccine. Tetanus up-to-date. She had mammogram in June. She plans to get flu shot next week. Her chronic problems include history of mild intermittent asthma, hypothyroidism, hypertension. Medications reviewed. Compliant with all. Nonsmoker. Mild weight gain over the past few years. No consistent exercise.  Past Medical History  Diagnosis Date  . Chicken pox   . Hypertension   . Hyperlipidemia   . Urine incontinence   . UTI (urinary tract infection)   . Heart murmur     VSD, ECHO 2010  . Thyroid disease     hypothyroid  . Positive TB test     treated with INH childhood  . History of MRSA infection 2010, 2012   Past Surgical History  Procedure Laterality Date  . Tonsillectomy    . Mrsa  2010, 2012    absess  . Strabismus surgery  1964    reports that she has never smoked. She does not have any smokeless tobacco history on file. Her alcohol and drug histories are not on file. family history includes Alcohol abuse in her father; Cancer in her mother; Cirrhosis in her father. Allergies  Allergen Reactions  . Wheat     Gluten intollerance      Review of Systems  Constitutional: Negative for fever, activity change, appetite change, fatigue and unexpected weight change.  HENT: Negative for ear pain, hearing loss, sore throat and trouble swallowing.   Eyes: Negative for visual disturbance.  Respiratory: Negative for cough and shortness of breath.   Cardiovascular: Negative for chest pain and palpitations.  Gastrointestinal: Negative for abdominal pain, diarrhea, constipation and blood in stool.  Genitourinary: Negative for dysuria and hematuria.  Musculoskeletal: Negative for myalgias, back pain and arthralgias.  Skin:  Negative for rash.  Neurological: Negative for dizziness, syncope and headaches.  Hematological: Negative for adenopathy.  Psychiatric/Behavioral: Negative for confusion and dysphoric mood.       Objective:   Physical Exam  Constitutional: She is oriented to person, place, and time. She appears well-developed and well-nourished.  HENT:  Head: Normocephalic and atraumatic.  Eyes: EOM are normal. Pupils are equal, round, and reactive to light.  Neck: Normal range of motion. Neck supple. No thyromegaly present.  Cardiovascular: Normal rate, regular rhythm and normal heart sounds.   No murmur heard. Pulmonary/Chest: Breath sounds normal. No respiratory distress. She has no wheezes. She has no rales.  Abdominal: Soft. Bowel sounds are normal. She exhibits no distension and no mass. There is no tenderness. There is no rebound and no guarding.  Genitourinary:  Normal external genitalia. Cervix normal in appearance. Pap smear obtained. Normal bimanual exam. No uterine masses or tenderness. No adnexal masses.  Musculoskeletal: Normal range of motion. She exhibits no edema.  Lymphadenopathy:    She has no cervical adenopathy.  Neurological: She is alert and oriented to person, place, and time. She displays normal reflexes. No cranial nerve deficit.  Skin: No rash noted.  Psychiatric: She has a normal mood and affect. Her behavior is normal. Judgment and thought content normal.          Assessment & Plan:  Complete physical. Check on coverage for shingles vaccine. Schedule repeat colonoscopy. Pap smear obtained today. Continue yearly  mammogram. Repeat liver transaminases and will add hepatitis C antibody in 3 months-she does not have any specific known risk factors for hepatitis C

## 2014-10-20 ENCOUNTER — Encounter: Payer: Self-pay | Admitting: Family Medicine

## 2014-10-20 ENCOUNTER — Encounter: Payer: Self-pay | Admitting: Gastroenterology

## 2014-10-20 LAB — CYTOLOGY - PAP

## 2014-10-20 NOTE — Telephone Encounter (Signed)
Entered in error

## 2014-10-22 ENCOUNTER — Other Ambulatory Visit: Payer: Self-pay | Admitting: Family Medicine

## 2014-10-22 MED ORDER — LEVOTHYROXINE SODIUM 100 MCG PO TABS: 100.0000 ug | ORAL_TABLET | Freq: Every day | ORAL | Status: DC

## 2014-10-22 MED ORDER — LISINOPRIL 20 MG PO TABS: 20.0000 mg | ORAL_TABLET | Freq: Every day | ORAL | Status: DC

## 2014-10-22 NOTE — Telephone Encounter (Signed)
Pt had cpe on 9/20 and was supposed to have her refills sent in. Pt needs  lisinopril (PRINIVIL,ZESTRIL) 20 MG tablet levothyroxine (SYNTHROID, LEVOTHROID) 100 MCG tablet  [facility] on [address]

## 2014-10-22 NOTE — Telephone Encounter (Signed)
Rx sent 

## 2014-11-30 ENCOUNTER — Ambulatory Visit (AMBULATORY_SURGERY_CENTER): Payer: Self-pay | Admitting: *Deleted

## 2014-11-30 VITALS — Ht 60.0 in | Wt 162.6 lb

## 2014-11-30 DIAGNOSIS — Z1211 Encounter for screening for malignant neoplasm of colon: Secondary | ICD-10-CM

## 2014-11-30 HISTORY — PX: COLONOSCOPY: SHX174

## 2014-11-30 MED ORDER — NA SULFATE-K SULFATE-MG SULF 17.5-3.13-1.6 GM/177ML PO SOLN: 1.0000 | Freq: Once | ORAL | Status: DC

## 2014-11-30 NOTE — Progress Notes (Signed)
No egg or soy allergy No issues with past sedation No diet pills No home 02 use  emmi declined Explained to pt that [facility] co pay for suprep is 75.00. She was offered mira/gatorade if too expensive. Choose suprep. Coupon given as well.

## 2014-12-13 ENCOUNTER — Ambulatory Visit (AMBULATORY_SURGERY_CENTER): Payer: 59 | Admitting: Gastroenterology

## 2014-12-13 ENCOUNTER — Encounter: Payer: Self-pay | Admitting: Gastroenterology

## 2014-12-13 VITALS — BP 123/73 | HR 75 | Temp 98.0°F | Resp 11 | Ht 61.0 in | Wt 162.0 lb

## 2014-12-13 DIAGNOSIS — D125 Benign neoplasm of sigmoid colon: Secondary | ICD-10-CM | POA: Diagnosis not present

## 2014-12-13 DIAGNOSIS — D120 Benign neoplasm of cecum: Secondary | ICD-10-CM

## 2014-12-13 DIAGNOSIS — Z1211 Encounter for screening for malignant neoplasm of colon: Secondary | ICD-10-CM | POA: Diagnosis present

## 2014-12-13 MED ORDER — SODIUM CHLORIDE 0.9 % IV SOLN: 500.0000 mL | INTRAVENOUS | Status: DC

## 2014-12-13 NOTE — Progress Notes (Signed)
A/ox3 pleased with MAC, report to 

## 2014-12-13 NOTE — Patient Instructions (Signed)
Discharge instructions given. Handouts on polyps and diverticulosis. Resume previous medications. YOU HAD AN ENDOSCOPIC PROCEDURE TODAY AT THE [facility]:   Refer to the procedure report that was given to you for any specific questions about what was found during the examination.  If the procedure report does not answer your questions, please call your gastroenterologist to clarify.  If you requested that your care partner not be given the details of your procedure findings, then the procedure report has been included in a sealed envelope for you to review at your convenience later.  YOU SHOULD EXPECT: Some feelings of bloating in the abdomen. Passage of more gas than usual.  Walking can help get rid of the air that was put into your GI tract during the procedure and reduce the bloating. If you had a lower endoscopy (such as a colonoscopy or flexible sigmoidoscopy) you may notice spotting of blood in your stool or on the toilet paper. If you underwent a bowel prep for your procedure, you may not have a normal bowel movement for a few days.  Please Note:  You might notice some irritation and congestion in your nose or some drainage.  This is from the oxygen used during your procedure.  There is no need for concern and it should clear up in a day or so.  SYMPTOMS TO REPORT IMMEDIATELY:   Following lower endoscopy (colonoscopy or flexible sigmoidoscopy):  Excessive amounts of blood in the stool  Significant tenderness or worsening of abdominal pains  Swelling of the abdomen that is new, acute  Fever of 100F or higher   For urgent or emergent issues, a gastroenterologist can be reached at any hour by calling [phone].   DIET: Your first meal following the procedure should be a small meal and then it is ok to progress to your normal diet. Heavy or fried foods are harder to digest and may make you feel nauseous or bloated.  Likewise, meals heavy in dairy and vegetables can  increase bloating.  Drink plenty of fluids but you should avoid alcoholic beverages for 24 hours.  ACTIVITY:  You should plan to take it easy for the rest of today and you should NOT DRIVE or use heavy machinery until tomorrow (because of the sedation medicines used during the test).    FOLLOW UP: Our staff will call the number listed on your records the next business day following your procedure to check on you and address any questions or concerns that you may have regarding the information given to you following your procedure. If we do not reach you, we will leave a message.  However, if you are feeling well and you are not experiencing any problems, there is no need to return our call.  We will assume that you have returned to your regular daily activities without incident.  If any biopsies were taken you will be contacted by phone or by letter within the next 1-3 weeks.  Please call us at [phone] if you have not heard about the biopsies in 3 weeks.    SIGNATURES/CONFIDENTIALITY: You and/or your care partner have signed paperwork which will be entered into your electronic medical record.  These signatures attest to the fact that that the information above on your After Visit Summary has been reviewed and is understood.  Full responsibility of the confidentiality of this discharge information lies with you and/or your care-partner. 

## 2014-12-13 NOTE — Progress Notes (Signed)
Called to room to assist during endoscopic procedure.  Patient ID and intended procedure confirmed with present staff. Received instructions for my participation in the procedure from the performing physician.  

## 2014-12-13 NOTE — Op Note (Signed)
[place]  [address] [place], [id number]   COLONOSCOPY PROCEDURE REPORT  PATIENT: Brianna, Jackson  MR#: [id number] BIRTHDATE: Jan 27, 1956 , 20  yrs. old GENDER: female ENDOSCOPIST: Yetta Flock, MD REFERRED BY: Carolann Littler MD PROCEDURE DATE:  12/13/2014 PROCEDURE:   Colonoscopy, screening First Screening Colonoscopy - Avg.  risk and is 50 yrs.  old or older - No.  Prior Negative Screening - Now for repeat screening. 10 or more years since last screening  History of Adenoma - Now for follow-up colonoscopy & has been > or = to 3 yrs.  N/A  Polyps removed today? Yes ASA CLASS:   Class II INDICATIONS:Screening for colonic neoplasia and Colorectal Neoplasm Risk Assessment for this procedure is average risk. MEDICATIONS: Propofol 200 mg IV  DESCRIPTION OF PROCEDURE:   After the risks benefits and alternatives of the procedure were thoroughly explained, informed consent was obtained.  The digital rectal exam revealed no abnormalities of the rectum.   The LB [id number]  endoscope was introduced through the anus and advanced to the cecum, which was identified by both the appendix and ileocecal valve. No adverse events experienced.   The quality of the prep was good.  The instrument was then slowly withdrawn as the colon was fully examined. Estimated blood loss is zero unless otherwise noted in this procedure report.  COLON FINDINGS: A sessile polyp measuring 3 mm in size was found at the cecum.  A polypectomy was performed with cold forceps.  The resection was complete, the polyp tissue was completely retrieved and sent to histology.   A sessile polyp measuring 4 mm in size was found in the sigmoid colon.  A polypectomy was performed with cold forceps.  The resection was complete, the polyp tissue was completely retrieved and sent to histology.   There was moderate diverticulosis noted throughout the entire examined colon.   The examination was  otherwise normal.  Retroflexed views revealed internal hemorrhoids. The time to cecum = 2.2 Withdrawal time = 15.4   The scope was withdrawn and the procedure completed. COMPLICATIONS: There were no immediate complications.  ENDOSCOPIC IMPRESSION: 1.   Sessile polyp was found at the cecum; polypectomy was performed with cold forceps 2.   Sessile polyp was found in the sigmoid colon; polypectomy was performed with cold forceps 3.   Moderate diverticulosis was noted throughout the entire examined colon 4.   The examination was otherwise normal  RECOMMENDATIONS: 1.  Await pathology results 2.  Resume diet 3. Resume medications  eSigned:  Yetta Flock, MD 12/13/2014 8:52 AM cc: Carolann Littler MD, the patient

## 2014-12-14 ENCOUNTER — Telehealth: Payer: Self-pay

## 2014-12-14 NOTE — Telephone Encounter (Signed)
  Follow up Call-  Call back number 12/13/2014  Post procedure Call Back phone  # [phone]  Permission to leave phone message Yes     Patient questions:  Do you have a fever, pain , or abdominal swelling? No. Pain Score  0 *  Have you tolerated food without any problems? Yes.    Have you been able to return to your normal activities? Yes.    Do you have any questions about your discharge instructions: Diet   No. Medications  No. Follow up visit  No.  Do you have questions or concerns about your Care? Yes.    Actions: * If pain score is 4 or above: No action needed, pain <4.

## 2014-12-17 ENCOUNTER — Encounter: Payer: Self-pay | Admitting: Gastroenterology

## 2015-02-15 MED FILL — LEVOTHYROXINE 100 MCG TAB: 100 | 90 days supply | Qty: 90 | Fill #1

## 2015-04-18 MED FILL — LISINOPRIL 20 MG TABLET: 20 | 90 days supply | Qty: 90 | Fill #2

## 2015-05-18 MED FILL — LEVOTHYROXINE 100 MCG TAB: 100 | 90 days supply | Qty: 90 | Fill #2

## 2015-06-24 ENCOUNTER — Other Ambulatory Visit: Payer: Self-pay

## 2015-06-24 DIAGNOSIS — Z1231 Encounter for screening mammogram for malignant neoplasm of breast: Secondary | ICD-10-CM

## 2015-07-21 ENCOUNTER — Ambulatory Visit: Payer: 59

## 2015-07-25 MED FILL — LISINOPRIL 20 MG TABLET: 20 | 90 days supply | Qty: 90 | Fill #3

## 2015-08-04 ENCOUNTER — Ambulatory Visit
Admission: RE | Admit: 2015-08-04 | Discharge: 2015-08-04 | Disposition: A | Discharge status: Home / Self Care | Payer: 59 | Source: Ambulatory Visit

## 2015-08-04 DIAGNOSIS — Z1231 Encounter for screening mammogram for malignant neoplasm of breast: Secondary | ICD-10-CM

## 2015-08-22 MED FILL — LEVOTHYROXINE 100 MCG TAB: 100 | 90 days supply | Qty: 90 | Fill #3

## 2015-10-10 ENCOUNTER — Other Ambulatory Visit: Payer: Self-pay | Admitting: Family Medicine

## 2015-10-10 MED FILL — LISINOPRIL 20 MG TABLET: 20 | 90 days supply | Qty: 90 | Fill #0

## 2015-10-31 DIAGNOSIS — H52203 Unspecified astigmatism, bilateral: Secondary | ICD-10-CM | POA: Diagnosis not present

## 2015-11-29 ENCOUNTER — Other Ambulatory Visit: Payer: Self-pay

## 2015-11-29 MED ORDER — LEVOTHYROXINE SODIUM 100 MCG PO TABS: 100.0000 ug | ORAL_TABLET | Freq: Every day | ORAL | 0 refills | Status: DC

## 2015-11-29 MED FILL — LEVOTHYROXINE 100 MCG TAB: 100 | 90 days supply | Qty: 90 | Fill #0

## 2016-01-17 MED FILL — LISINOPRIL 20 MG TABLET: 20 | 90 days supply | Qty: 90 | Fill #1

## 2016-02-01 ENCOUNTER — Other Ambulatory Visit (INDEPENDENT_AMBULATORY_CARE_PROVIDER_SITE_OTHER): Payer: 59

## 2016-02-01 DIAGNOSIS — Z0000 Encounter for general adult medical examination without abnormal findings: Secondary | ICD-10-CM

## 2016-02-01 LAB — BASIC METABOLIC PANEL
BUN: 11 mg/dL (ref 6–23)
CO2: 29 mEq/L (ref 19–32)
CREATININE: 0.9 mg/dL (ref 0.40–1.20)
Calcium: 8.8 mg/dL (ref 8.4–10.5)
Chloride: 106 mEq/L (ref 96–112)
GFR: 67.49 mL/min (ref >60.00)
Glucose, Bld: 89 mg/dL (ref 70–99)
POTASSIUM: 3.9 meq/L (ref 3.5–5.1)
Sodium: 142 mEq/L (ref 135–145)

## 2016-02-01 LAB — CBC WITH DIFFERENTIAL/PLATELET
Basophils Absolute: 0.1 10*3/uL (ref 0.0–0.1)
Basophils Relative: 0.7 % (ref 0.0–3.0)
EOS ABS: 0.2 10*3/uL (ref 0.0–0.7)
EOS PCT: 2.4 % (ref 0.0–5.0)
HEMATOCRIT: 39 % (ref 36.0–46.0)
HEMOGLOBIN: 13.4 g/dL (ref 12.0–15.0)
LYMPHS PCT: 16.6 % (ref 12.0–46.0)
Lymphs Abs: 1.4 10*3/uL (ref 0.7–4.0)
MCHC: 34.4 g/dL (ref 30.0–36.0)
MCV: 82.9 fl (ref 78.0–100.0)
MONO ABS: 0.6 10*3/uL (ref 0.1–1.0)
Monocytes Relative: 7.8 % (ref 3.0–12.0)
Neutro Abs: 6 10*3/uL (ref 1.4–7.7)
Neutrophils Relative %: 72.5 % (ref 43.0–77.0)
Platelets: 255 10*3/uL (ref 150.0–400.0)
RBC: 4.71 Mil/uL (ref 3.87–5.11)
RDW: 14.1 % (ref 11.5–15.5)
WBC: 8.2 10*3/uL (ref 4.0–10.5)

## 2016-02-01 LAB — LIPID PANEL
CHOL/HDL RATIO: 4
Cholesterol: 195 mg/dL (ref 0–200)
HDL: 47.7 mg/dL (ref >39.00)
LDL CALC: 127 mg/dL — AB (ref 0–99)
NONHDL: 147.18
Triglycerides: 100 mg/dL (ref 0.0–149.0)
VLDL: 20 mg/dL (ref 0.0–40.0)

## 2016-02-01 LAB — HEPATIC FUNCTION PANEL
ALK PHOS: 84 U/L (ref 39–117)
ALT: 32 U/L (ref 0–35)
AST: 28 U/L (ref 0–37)
Albumin: 3.9 g/dL (ref 3.5–5.2)
BILIRUBIN DIRECT: 0.1 mg/dL (ref 0.0–0.3)
BILIRUBIN TOTAL: 0.8 mg/dL (ref 0.2–1.2)
Total Protein: 6.2 g/dL (ref 6.0–8.3)

## 2016-02-01 LAB — TSH: TSH: 4.56 u[IU]/mL — AB (ref 0.35–4.50)

## 2016-02-06 ENCOUNTER — Ambulatory Visit (INDEPENDENT_AMBULATORY_CARE_PROVIDER_SITE_OTHER): Payer: 59 | Admitting: Family Medicine

## 2016-02-06 ENCOUNTER — Encounter: Payer: Self-pay | Admitting: Family Medicine

## 2016-02-06 VITALS — BP 120/82 | HR 93 | Temp 98.3°F | Ht 61.0 in | Wt 164.0 lb

## 2016-02-06 DIAGNOSIS — E039 Hypothyroidism, unspecified: Secondary | ICD-10-CM

## 2016-02-06 DIAGNOSIS — Z1159 Encounter for screening for other viral diseases: Secondary | ICD-10-CM | POA: Diagnosis not present

## 2016-02-06 DIAGNOSIS — Z0000 Encounter for general adult medical examination without abnormal findings: Secondary | ICD-10-CM

## 2016-02-06 NOTE — Progress Notes (Signed)
Subjective:     Patient ID: Brianna Jackson, female   DOB: 06/29/1955, 61 y.o.   MRN: [id number]  HPI Patient seen for physical exam. She has history of hypertension, ventricular septal defect, hypothyroidism. She works in [employer]. Her medications include lisinopril 20 mgs daily levothyroxine 100 g daily. She's been compliant with medications. She has had some mild weight gain during the past year. Poor compliance with exercise. She has some fatigue and occasional cold intolerance.  Colonoscopy is up-to-date. Tetanus up-to-date. She had Pap smear over year ago which was normal. She had mammogram last summer which was normal. Has already had flu vaccine. No history of shingles vaccine. 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 Allergy    seasonal  . Chicken pox   . GERD (gastroesophageal reflux disease)    not diagnosed  . Heart murmur    VSD, ECHO 2010  . History of MRSA infection 2010, 2012  . Hyperlipidemia   . Hypertension   . 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 Thyroid disease    hypothyroid  . Urine incontinence   . UTI (urinary tract infection)    Past Surgical History:  Procedure Laterality Date  . COLONOSCOPY  2006  . MOLE REMOVAL  2008  . MRSA  2010, 2012   absess  . [place]  . TONSILLECTOMY      reports that she has never smoked. She has never used smokeless tobacco. She reports that she drinks alcohol. She reports that she does not use drugs. family history includes Alcohol abuse in her father; Cancer in her mother; Cirrhosis in her father. Allergies  Allergen Reactions  . Other Hives    Nuts   . Wheat     Gluten intollerance     Review of Systems  Constitutional: Negative for activity change, appetite change, fatigue, fever and unexpected weight change.  HENT: Negative for ear pain, hearing loss, sore throat and trouble  swallowing.   Eyes: Negative for visual disturbance.  Respiratory: Negative for cough and shortness of breath.   Cardiovascular: Negative for chest pain and palpitations.  Gastrointestinal: Negative for abdominal pain, blood in stool, constipation and diarrhea.  Genitourinary: Negative for dysuria and hematuria.  Musculoskeletal: Negative for arthralgias, back pain and myalgias.  Skin: Negative for rash.  Neurological: Negative for dizziness, syncope and headaches.  Hematological: Negative for adenopathy.  Psychiatric/Behavioral: Negative for confusion and dysphoric mood.       Objective:   Physical Exam  Constitutional: She is oriented to person, place, and time. She appears well-developed and well-nourished.  HENT:  Head: Normocephalic and atraumatic.  Eyes: EOM are normal. Pupils are equal, round, and reactive to light.  Neck: Normal range of motion. Neck supple. No thyromegaly present.  Cardiovascular: Normal rate, regular rhythm and normal heart sounds.   No murmur heard. Pulmonary/Chest: Breath sounds normal. No respiratory distress. She has no wheezes. She has no rales.  Abdominal: Soft. Bowel sounds are normal. She exhibits no distension and no mass. There is no tenderness. There is no rebound and no guarding.  Musculoskeletal: Normal range of motion. She exhibits no edema.  Lymphadenopathy:    She has no cervical adenopathy.  Neurological: She is alert and oriented to person, place, and time. She displays normal reflexes. No cranial nerve deficit.  Skin: No rash noted.  Psychiatric: She has a  normal mood and affect. Her behavior is normal. Judgment and thought content normal.       Assessment:     Physical exam. Labs reviewed. TSH minimally elevated 4.5    Plan:     -Repeat labs in 3 months with TSH, free T4, and hepatitis C antibody -Check on coverage for shingles vaccine and schedule if interested -Establish more consistent exercise -She'll continue with yearly  mammograms. -Every 3 year Pap smears as she is low risk  Eulas Post MD [facility]

## 2016-02-06 NOTE — Progress Notes (Signed)
Pre visit review using our clinic review tool, if applicable. No additional management support is needed unless otherwise documented below in the visit note. 

## 2016-02-06 NOTE — Patient Instructions (Signed)
Check on coverage for shingles vaccine 

## 2016-02-29 ENCOUNTER — Other Ambulatory Visit: Payer: Self-pay

## 2016-02-29 MED ORDER — LEVOTHYROXINE SODIUM 100 MCG PO TABS
100.0000 ug | ORAL_TABLET | Freq: Every day | ORAL | 3 refills | Status: DC
Start: 2016-02-29 — End: 2017-03-13

## 2016-02-29 MED FILL — LEVOTHYROXINE 100 MCG TABLE: 100 | 90 days supply | Qty: 90 | Fill #0

## 2016-04-23 ENCOUNTER — Other Ambulatory Visit: Payer: Self-pay | Admitting: Family Medicine

## 2016-04-23 MED FILL — LISINOPRIL 20 MG TABLET: 20 | 90 days supply | Qty: 90 | Fill #0

## 2016-05-29 MED FILL — LEVOTHYROXINE 100 MCG TABLE: 100 | 90 days supply | Qty: 90 | Fill #1

## 2016-07-23 MED FILL — LISINOPRIL 20 MG TABLET: 20 | 90 days supply | Qty: 90 | Fill #1

## 2016-09-05 MED FILL — LEVOTHYROXINE 100 MCG TABLE: 100 | 90 days supply | Qty: 90 | Fill #2

## 2016-09-28 ENCOUNTER — Other Ambulatory Visit: Payer: Self-pay | Admitting: Family Medicine

## 2016-09-28 DIAGNOSIS — Z1231 Encounter for screening mammogram for malignant neoplasm of breast: Secondary | ICD-10-CM

## 2016-10-10 ENCOUNTER — Ambulatory Visit
Admission: RE | Admit: 2016-10-10 | Discharge: 2016-10-10 | Disposition: A | Discharge status: Home / Self Care | Payer: 59 | Source: Ambulatory Visit | Attending: Family Medicine | Admitting: Family Medicine

## 2016-10-10 DIAGNOSIS — Z1231 Encounter for screening mammogram for malignant neoplasm of breast: Secondary | ICD-10-CM

## 2016-10-18 ENCOUNTER — Encounter: Payer: Self-pay | Admitting: Family Medicine

## 2016-10-31 ENCOUNTER — Other Ambulatory Visit: Payer: Self-pay | Admitting: Family Medicine

## 2016-11-01 MED FILL — LISINOPRIL 20 MG TABS: 20 | 90 days supply | Qty: 90 | Fill #0

## 2016-12-03 MED FILL — LEVOTHYROXINE 100 MCG TABLE: 100 | 90 days supply | Qty: 90 | Fill #3

## 2017-01-28 ENCOUNTER — Other Ambulatory Visit: Payer: Self-pay | Admitting: Family Medicine

## 2017-01-28 MED FILL — LISINOPRIL 20 MG TABLET: 20 | 90 days supply | Qty: 90 | Fill #0

## 2017-02-19 ENCOUNTER — Encounter: Payer: Self-pay | Admitting: Family Medicine

## 2017-02-19 ENCOUNTER — Ambulatory Visit (INDEPENDENT_AMBULATORY_CARE_PROVIDER_SITE_OTHER): Payer: 59 | Admitting: Family Medicine

## 2017-02-19 VITALS — BP 110/78 | HR 83 | Temp 97.1°F | Ht 61.0 in | Wt 167.0 lb

## 2017-02-19 DIAGNOSIS — Z0000 Encounter for general adult medical examination without abnormal findings: Secondary | ICD-10-CM

## 2017-02-19 DIAGNOSIS — R252 Cramp and spasm: Secondary | ICD-10-CM

## 2017-02-19 DIAGNOSIS — Z23 Encounter for immunization: Secondary | ICD-10-CM | POA: Diagnosis not present

## 2017-02-19 LAB — CBC WITH DIFFERENTIAL/PLATELET
BASOS ABS: 0.1 10*3/uL (ref 0.0–0.1)
Basophils Relative: 0.7 % (ref 0.0–3.0)
EOS ABS: 0.2 10*3/uL (ref 0.0–0.7)
Eosinophils Relative: 2.8 % (ref 0.0–5.0)
HEMATOCRIT: 40.9 % (ref 36.0–46.0)
Hemoglobin: 13.9 g/dL (ref 12.0–15.0)
LYMPHS ABS: 1.7 10*3/uL (ref 0.7–4.0)
LYMPHS PCT: 19.9 % (ref 12.0–46.0)
MCHC: 34 g/dL (ref 30.0–36.0)
MCV: 84.2 fl (ref 78.0–100.0)
Monocytes Absolute: 0.6 10*3/uL (ref 0.1–1.0)
Monocytes Relative: 6.9 % (ref 3.0–12.0)
NEUTROS PCT: 69.7 % (ref 43.0–77.0)
Neutro Abs: 6 10*3/uL (ref 1.4–7.7)
PLATELETS: 296 10*3/uL (ref 150.0–400.0)
RBC: 4.86 Mil/uL (ref 3.87–5.11)
RDW: 14.1 % (ref 11.5–15.5)
WBC: 8.7 10*3/uL (ref 4.0–10.5)

## 2017-02-19 LAB — BASIC METABOLIC PANEL
BUN: 15 mg/dL (ref 6–23)
CHLORIDE: 104 meq/L (ref 96–112)
CO2: 31 meq/L (ref 19–32)
CREATININE: 0.83 mg/dL (ref 0.40–1.20)
Calcium: 9.2 mg/dL (ref 8.4–10.5)
GFR: 73.85 mL/min (ref >60.00)
Glucose, Bld: 95 mg/dL (ref 70–99)
Potassium: 3.9 mEq/L (ref 3.5–5.1)
Sodium: 141 mEq/L (ref 135–145)

## 2017-02-19 LAB — LIPID PANEL
CHOL/HDL RATIO: 4
Cholesterol: 190 mg/dL (ref 0–200)
HDL: 45.6 mg/dL (ref >39.00)
LDL CALC: 126 mg/dL — AB (ref 0–99)
NonHDL: 143.93
Triglycerides: 88 mg/dL (ref 0.0–149.0)
VLDL: 17.6 mg/dL (ref 0.0–40.0)

## 2017-02-19 LAB — HEPATIC FUNCTION PANEL
ALBUMIN: 4.1 g/dL (ref 3.5–5.2)
ALT: 23 U/L (ref 0–35)
AST: 24 U/L (ref 0–37)
Alkaline Phosphatase: 99 U/L (ref 39–117)
BILIRUBIN TOTAL: 0.7 mg/dL (ref 0.2–1.2)
Bilirubin, Direct: 0.1 mg/dL (ref 0.0–0.3)
Total Protein: 6.6 g/dL (ref 6.0–8.3)

## 2017-02-19 LAB — TSH: TSH: 4.16 u[IU]/mL (ref 0.35–4.50)

## 2017-02-19 LAB — MAGNESIUM: MAGNESIUM: 2.1 mg/dL (ref 1.5–2.5)

## 2017-02-19 MED ORDER — ALBUTEROL SULFATE HFA 108 (90 BASE) MCG/ACT IN AERS: 1.0000 | INHALATION_SPRAY | RESPIRATORY_TRACT | 2 refills | Status: DC | PRN

## 2017-02-19 MED FILL — VENTOLIN HFA 90 MCG INHALER: 108 (90 BAS | 17 days supply | Qty: 18 | Fill #0

## 2017-02-19 NOTE — Patient Instructions (Signed)
Muscle Cramps and Spasms Muscle cramps and spasms occur when a muscle or muscles tighten and you have no control over this tightening (involuntary muscle contraction). They are a common problem and can develop in any muscle. The most common place is in the calf muscles of the leg. Muscle cramps and muscle spasms are both involuntary muscle contractions, but there are some differences between the two:  Muscle cramps are painful. They come and go and may last a few seconds to 15 minutes. Muscle cramps are often more forceful and last longer than muscle spasms.  Muscle spasms may or may not be painful. They may also last just a few seconds or much longer.  Certain medical conditions, such as diabetes or Parkinson disease, can make it more likely to develop cramps or spasms. However, cramps or spasms are usually not caused by a serious underlying problem. Common causes include:  Overexertion.  Overuse from repetitive motions, or doing the same thing over and over.  Remaining in a certain position for a long period of time.  Improper preparation, form, or technique while playing a sport or doing an activity.  Dehydration.  Injury.  Side effects of some medicines.  Abnormally low levels of the salts and ions in your blood (electrolytes), especially potassium and calcium. This could happen if you are taking water pills (diuretics) or if you are pregnant.  In many cases, the cause of muscle cramps or spasms is unknown. Follow these instructions at home:  Stay well hydrated. Drink enough fluid to keep your urine clear or pale yellow.  Try massaging, stretching, and relaxing the affected muscle.  If directed, apply heat to tight or tense muscles as often as told by your health care provider. Use the heat source that your health care provider recommends, such as a moist heat pack or a heating pad. ? Place a towel between your skin and the heat source. ? Leave the heat on for 20-30  minutes. ? Remove the heat if your skin turns bright red. This is especially important if you are unable to feel pain, heat, or cold. You may have a greater risk of getting burned.  If directed, put ice on the affected area. This may help if you are sore or have pain after a cramp or spasm. ? Put ice in a plastic bag. ? Place a towel between your skin and the bag. ? Leavethe ice on for 20 minutes, 2-3 times a day.  Take over-the-counter and prescription medicines only as told by your health care provider.  Pay attention to any changes in your symptoms. Contact a health care provider if:  Your cramps or spasms get more severe or happen more often.  Your cramps or spasms do not improve over time. This information is not intended to replace advice given to you by your health care provider. Make sure you discuss any questions you have with your health care provider. Document Released: 07/07/2001 Document Revised: 02/16/2015 Document Reviewed: 10/19/2014 Elsevier Interactive Patient Education  2018 [employer]  

## 2017-02-19 NOTE — Progress Notes (Signed)
Subjective:     Patient ID: Brianna Jackson, female   DOB: Jul 14, 1955, 62 y.o.   MRN: [id number]  HPI Patient seen for physical exam. Her chronic problems include history of obesity, hypothyroidism, central hypertension, and ventricular septal defect. She remains on lisinopril and levothyroxin. Compliant with therapy.  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 Not getting regular exercise. She's had some issues in the past year of progressive muscle cramps involving multiple muscles including upper and lower extremities. She feels she is hydrating well. No diuretic use.  No history of shingles vaccine. Colonoscopy up-to-date. Other vaccinations up-to-date.  Patient has no family history of celiac disease. However, she thinks she had positive tissue transglutaminase antibody years ago. She would like to be rescreened if possible. No history of iron deficiency anemia. No recent GI symptoms such as diarrhea or cramping.  Past Medical History:  Diagnosis Date  . Allergy    seasonal  . Chicken pox   . GERD (gastroesophageal reflux disease)    not diagnosed  . Heart murmur    VSD, ECHO 2010  . History of MRSA infection 2010, 2012  . Hyperlipidemia   . Hypertension   . 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 Thyroid disease    hypothyroid  . Urine incontinence   . UTI (urinary tract infection)    Past Surgical History:  Procedure Laterality Date  . COLONOSCOPY  2006  . MOLE REMOVAL  2008  . MRSA  2010, 2012   absess  . [place]  . TONSILLECTOMY      reports that  has never smoked. she has never used smokeless tobacco. She reports that she drinks alcohol. She reports that she does not use drugs. family history includes Alcohol abuse in her father; Cancer in her mother; Cirrhosis in her father. Allergies  Allergen Reactions  . Other Hives    Nuts   . Wheat     Gluten intollerance     Review of Systems   Constitutional: Negative for activity change, appetite change, fatigue, fever and unexpected weight change.  HENT: Negative for ear pain, hearing loss, sore throat and trouble swallowing.   Eyes: Negative for visual disturbance.  Respiratory: Negative for cough and shortness of breath.   Cardiovascular: Negative for chest pain and palpitations.  Gastrointestinal: Negative for abdominal pain, blood in stool, constipation and diarrhea.  Endocrine: Negative for polydipsia and polyuria.  Genitourinary: Negative for dysuria and hematuria.  Musculoskeletal: Negative for arthralgias, back pain and myalgias.  Skin: Negative for rash.  Neurological: Negative for dizziness, syncope and headaches.  Hematological: Negative for adenopathy.  Psychiatric/Behavioral: Negative for confusion and dysphoric mood.       Objective:   Physical Exam  Constitutional: She is oriented to person, place, and time. She appears well-developed and well-nourished.  HENT:  Head: Normocephalic and atraumatic.  Eyes: EOM are normal. Pupils are equal, round, and reactive to light.  Neck: Normal range of motion. Neck supple. No thyromegaly present.  Cardiovascular: Normal rate, regular rhythm and normal heart sounds.  No murmur heard. Pulmonary/Chest: Breath sounds normal. No respiratory distress. She has no wheezes. She has no rales.  Breasts are symmetric with no mass. No skin dimpling. No nipple inversion.  Abdominal: Soft. Bowel sounds are normal. She exhibits no distension and no mass. There is no tenderness. There is no rebound and no guarding.  Musculoskeletal: Normal range of motion. She exhibits  no edema.  Lymphadenopathy:    She has no cervical adenopathy.  Neurological: She is alert and oriented to person, place, and time. She displays normal reflexes. No cranial nerve deficit.  Skin: No rash noted.  Psychiatric: She has a normal mood and affect. Her behavior is normal. Judgment and thought content normal.        Assessment:     Physical exam. Following issues were addressed.    Plan:     -Check lab work. Include magnesium level because of her frequent muscle cramps -Emphasized importance of good hydration -Check hepatitis C antibody -We discussed new shingles vaccine she was interested. First vaccine was given. Follow-up booster in 2-6 months -Patient requested repeat tissue transglutaminase level. History of prior reported elevation. Low clinical suspicion for celiac disease  Eulas Post MD [facility]

## 2017-02-21 LAB — HEPATITIS C ANTIBODY
Hepatitis C Ab: NONREACTIVE
SIGNAL TO CUT-OFF: 0.04 (ref <1.00)

## 2017-02-21 LAB — TISSUE TRANSGLUTAMINASE ABS,IGG,IGA
(TTG) AB, IGA: 10 U/mL — AB
(tTG) Ab, IgG: 3 U/mL

## 2017-02-24 ENCOUNTER — Encounter: Payer: Self-pay | Admitting: Family Medicine

## 2017-03-13 ENCOUNTER — Other Ambulatory Visit: Payer: Self-pay | Admitting: Family Medicine

## 2017-03-13 MED FILL — LEVOTHYROXINE 100 MCG TABLE: 100 | 90 days supply | Qty: 90 | Fill #0

## 2017-04-29 ENCOUNTER — Other Ambulatory Visit: Payer: Self-pay | Admitting: Family Medicine

## 2017-04-29 MED FILL — LISINOPRIL 20 MG TABLET: 20 | 90 days supply | Qty: 90 | Fill #0

## 2017-05-16 ENCOUNTER — Ambulatory Visit (INDEPENDENT_AMBULATORY_CARE_PROVIDER_SITE_OTHER): Payer: 59 | Admitting: Family Medicine

## 2017-05-16 DIAGNOSIS — Z23 Encounter for immunization: Secondary | ICD-10-CM

## 2017-06-04 DIAGNOSIS — H52203 Unspecified astigmatism, bilateral: Secondary | ICD-10-CM | POA: Diagnosis not present

## 2017-06-11 MED FILL — LEVOTHYROXINE 100 MCG TABLE: 100 | 90 days supply | Qty: 90 | Fill #1

## 2017-07-22 MED FILL — LISINOPRIL 20 MG TABLET: 20 | 90 days supply | Qty: 90 | Fill #1

## 2017-09-09 MED FILL — LEVOTHYROXINE 100 MCG TABLE: 100 | 90 days supply | Qty: 90 | Fill #2

## 2017-09-17 ENCOUNTER — Other Ambulatory Visit: Payer: Self-pay | Admitting: Family Medicine

## 2017-09-17 DIAGNOSIS — Z1231 Encounter for screening mammogram for malignant neoplasm of breast: Secondary | ICD-10-CM

## 2017-10-17 ENCOUNTER — Ambulatory Visit
Admission: RE | Admit: 2017-10-17 | Discharge: 2017-10-17 | Disposition: A | Discharge status: Home / Self Care | Payer: 59 | Source: Ambulatory Visit | Attending: Family Medicine | Admitting: Family Medicine

## 2017-10-17 DIAGNOSIS — Z1231 Encounter for screening mammogram for malignant neoplasm of breast: Secondary | ICD-10-CM

## 2017-10-28 MED FILL — LISINOPRIL 20 MG TABLET: 20 | 90 days supply | Qty: 90 | Fill #2

## 2017-12-10 MED FILL — LEVOTHYROXINE 100 MCG TABLE: 100 | 90 days supply | Qty: 90 | Fill #3

## 2018-02-03 ENCOUNTER — Other Ambulatory Visit: Payer: Self-pay | Admitting: Family Medicine

## 2018-02-03 MED FILL — LISINOPRIL 20 MG TABLET: 20 | 30 days supply | Qty: 30 | Fill #0

## 2018-02-26 ENCOUNTER — Encounter: Payer: Self-pay | Admitting: Family Medicine

## 2018-02-26 ENCOUNTER — Ambulatory Visit (INDEPENDENT_AMBULATORY_CARE_PROVIDER_SITE_OTHER): Payer: 59 | Admitting: Family Medicine

## 2018-02-26 ENCOUNTER — Other Ambulatory Visit: Payer: Self-pay

## 2018-02-26 VITALS — BP 130/86 | HR 77 | Temp 97.6°F | Ht 60.0 in | Wt 164.1 lb

## 2018-02-26 DIAGNOSIS — Z0000 Encounter for general adult medical examination without abnormal findings: Secondary | ICD-10-CM

## 2018-02-26 LAB — BASIC METABOLIC PANEL
BUN: 12 mg/dL (ref 6–23)
CALCIUM: 9.5 mg/dL (ref 8.4–10.5)
CO2: 28 mEq/L (ref 19–32)
Chloride: 104 mEq/L (ref 96–112)
Creatinine, Ser: 0.9 mg/dL (ref 0.40–1.20)
GFR: 63.08 mL/min (ref >60.00)
GLUCOSE: 85 mg/dL (ref 70–99)
Potassium: 3.7 mEq/L (ref 3.5–5.1)
Sodium: 140 mEq/L (ref 135–145)

## 2018-02-26 LAB — CBC WITH DIFFERENTIAL/PLATELET
BASOS ABS: 0.1 10*3/uL (ref 0.0–0.1)
Basophils Relative: 1 % (ref 0.0–3.0)
EOS PCT: 3.9 % (ref 0.0–5.0)
Eosinophils Absolute: 0.3 10*3/uL (ref 0.0–0.7)
HCT: 40.5 % (ref 36.0–46.0)
HEMOGLOBIN: 13.9 g/dL (ref 12.0–15.0)
Lymphocytes Relative: 24.7 % (ref 12.0–46.0)
Lymphs Abs: 1.8 10*3/uL (ref 0.7–4.0)
MCHC: 34.2 g/dL (ref 30.0–36.0)
MCV: 83.5 fl (ref 78.0–100.0)
MONO ABS: 0.4 10*3/uL (ref 0.1–1.0)
MONOS PCT: 6.2 % (ref 3.0–12.0)
Neutro Abs: 4.6 10*3/uL (ref 1.4–7.7)
Neutrophils Relative %: 64.2 % (ref 43.0–77.0)
Platelets: 289 10*3/uL (ref 150.0–400.0)
RBC: 4.85 Mil/uL (ref 3.87–5.11)
RDW: 14.5 % (ref 11.5–15.5)
WBC: 7.1 10*3/uL (ref 4.0–10.5)

## 2018-02-26 LAB — TSH: TSH: 1.44 u[IU]/mL (ref 0.35–4.50)

## 2018-02-26 LAB — LIPID PANEL
CHOL/HDL RATIO: 4
Cholesterol: 182 mg/dL (ref 0–200)
HDL: 40.9 mg/dL (ref >39.00)
LDL Cholesterol: 117 mg/dL — ABNORMAL HIGH (ref 0–99)
NONHDL: 141.28
Triglycerides: 119 mg/dL (ref 0.0–149.0)
VLDL: 23.8 mg/dL (ref 0.0–40.0)

## 2018-02-26 LAB — HEPATIC FUNCTION PANEL
ALBUMIN: 4.2 g/dL (ref 3.5–5.2)
ALK PHOS: 84 U/L (ref 39–117)
ALT: 24 U/L (ref 0–35)
AST: 22 U/L (ref 0–37)
BILIRUBIN DIRECT: 0.1 mg/dL (ref 0.0–0.3)
TOTAL PROTEIN: 6.5 g/dL (ref 6.0–8.3)
Total Bilirubin: 0.6 mg/dL (ref 0.2–1.2)

## 2018-02-26 NOTE — Progress Notes (Signed)
Subjective:     Patient ID: Brianna Jackson, female   DOB: [birth year], 63 y.o.   MRN: [id number]  HPI Patient is seen for physical exam.  She has history of VSD and hypertension as well as hypothyroidism.  Medications reviewed.  Compliant with all.  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  She is looking at possible retirement in 14 months.  She had Pap smear 3 years ago.  Mammograms up-to-date.  She had shingles vaccine last year.  Colonoscopy and tetanus up-to-date.  Past Medical History:  Diagnosis Date  . Allergy    seasonal  . Chicken pox   . GERD (gastroesophageal reflux disease)    not diagnosed  . Heart murmur    VSD, ECHO 2010  . History of MRSA infection 2010, 2012  . Hyperlipidemia   . Hypertension   . 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 Thyroid disease    hypothyroid  . Urine incontinence   . UTI (urinary tract infection)    Past Surgical History:  Procedure Laterality Date  . COLONOSCOPY  2006  . MOLE REMOVAL  2008  . MRSA  2010, 2012   absess  . [place]  . TONSILLECTOMY      reports that she has never smoked. She has never used smokeless tobacco. She reports current alcohol use. She reports that she does not use drugs. family history includes Alcohol abuse in her father; Cancer in her mother; Cirrhosis in her father. Allergies  Allergen Reactions  . Other Hives    Nuts   . Wheat     Gluten intollerance     Review of Systems  Constitutional: Negative for activity change, appetite change, fatigue, fever and unexpected weight change.  HENT: Negative for ear pain, hearing loss, sore throat and trouble swallowing.   Eyes: Negative for visual disturbance.  Respiratory: Negative for cough and shortness of breath.   Cardiovascular: Negative for chest pain and palpitations.  Gastrointestinal: Negative for abdominal pain, blood in stool, constipation and diarrhea.  Genitourinary: Negative for dysuria and  hematuria.  Musculoskeletal: Negative for arthralgias, back pain and myalgias.  Skin: Negative for rash.  Neurological: Negative for dizziness, syncope and headaches.  Hematological: Negative for adenopathy.  Psychiatric/Behavioral: Negative for confusion and dysphoric mood.       Objective:   Physical Exam Constitutional:      Appearance: She is well-developed.  HENT:     Head: Normocephalic and atraumatic.     Mouth/Throat:     Pharynx: Oropharynx is clear. No posterior oropharyngeal erythema.  Eyes:     Pupils: Pupils are equal, round, and reactive to light.  Neck:     Musculoskeletal: Normal range of motion and neck supple.     Thyroid: No thyromegaly.  Cardiovascular:     Rate and Rhythm: Normal rate and regular rhythm.     Heart sounds: Murmur present.     Comments: Prominent murmur RUSB and left heart borde (known VSD) Pulmonary:     Effort: No respiratory distress.     Breath sounds: Normal breath sounds. No wheezing or rales.  Abdominal:     General: Bowel sounds are normal. There is no distension.     Palpations: Abdomen is soft. There is no mass.     Tenderness: There is no abdominal tenderness. There is no guarding or rebound.  Musculoskeletal: Normal range of motion.  Lymphadenopathy:     Cervical: No cervical  adenopathy.  Skin:    Findings: No rash.  Neurological:     Mental Status: She is alert and oriented to person, place, and time.  Psychiatric:        Behavior: Behavior normal.        Thought Content: Thought content normal.        Judgment: Judgment normal.        Assessment:     Physical exam.  She has persistent VSD but is asymptomatic.  No Echo in years.  The following preventative issues were discussed    Plan:     -Check screening labs -Continue with yearly mammogram -Repeat Pap smear by next year -discussed possible repeat Echo/cardiology referral in next year or two.  Eulas Post MD [facility] at [facility]

## 2018-02-27 ENCOUNTER — Encounter: Payer: Self-pay | Admitting: Family Medicine

## 2018-03-03 ENCOUNTER — Other Ambulatory Visit: Payer: Self-pay | Admitting: Family Medicine

## 2018-03-03 MED FILL — LEVOTHYROXINE 100 MCG TABLE: 100 | 90 days supply | Qty: 90 | Fill #0 | Status: TO

## 2018-03-03 MED FILL — LISINOPRIL 20 MG TABLET: 20 | 90 days supply | Qty: 90 | Fill #0 | Status: TO

## 2018-05-19 MED FILL — LEVOTHYROXINE 100 MCG TAB: 100 | 90 days supply | Qty: 90 | Fill #0

## 2018-05-19 MED FILL — LISINOPRIL 20 MG TABLET: 20 | 90 days supply | Qty: 90 | Fill #0

## 2018-08-11 MED FILL — LISINOPRIL 20 MG TABLET: 20 | 90 days supply | Qty: 90 | Fill #1

## 2018-08-11 MED FILL — LEVOTHYROXINE 100 MCG TAB: 100 | 90 days supply | Qty: 90 | Fill #1

## 2018-08-28 DIAGNOSIS — H5203 Hypermetropia, bilateral: Secondary | ICD-10-CM | POA: Diagnosis not present

## 2018-10-01 ENCOUNTER — Other Ambulatory Visit: Payer: Self-pay | Admitting: Family Medicine

## 2018-10-01 DIAGNOSIS — Z1231 Encounter for screening mammogram for malignant neoplasm of breast: Secondary | ICD-10-CM

## 2018-11-09 MED FILL — LISINOPRIL 20 MG TABLET: 20 | 90 days supply | Qty: 90 | Fill #2

## 2018-11-09 MED FILL — LEVOTHYROXINE 100 MCG TAB: 100 | 90 days supply | Qty: 90 | Fill #2

## 2018-11-14 ENCOUNTER — Ambulatory Visit: Payer: 59

## 2018-11-25 ENCOUNTER — Telehealth: Payer: Self-pay

## 2018-11-25 NOTE — Telephone Encounter (Signed)
Attempted to reach pt, no answer. No option to leave a vm

## 2018-11-25 NOTE — Telephone Encounter (Signed)
Copied from [place] [id number]. Topic: General - Other >> Nov 25, 2018  8:51 AM Burchel, Abbi R wrote: Reason for CRM: Pt states she was instructed by [employer] to notify office that she is COVID positive.  Please call pt for further information. Pt: [phone]

## 2018-11-25 NOTE — Telephone Encounter (Signed)
Set up Doxy if she would like to discuss

## 2018-12-05 ENCOUNTER — Ambulatory Visit
Admission: RE | Admit: 2018-12-05 | Discharge: 2018-12-05 | Disposition: A | Discharge status: Home / Self Care | Payer: 59 | Source: Ambulatory Visit | Attending: Family Medicine | Admitting: Family Medicine

## 2018-12-05 ENCOUNTER — Other Ambulatory Visit: Payer: Self-pay

## 2018-12-05 DIAGNOSIS — Z1231 Encounter for screening mammogram for malignant neoplasm of breast: Secondary | ICD-10-CM

## 2019-02-07 ENCOUNTER — Other Ambulatory Visit: Payer: Self-pay | Admitting: Family Medicine

## 2019-02-09 MED FILL — LISINOPRIL 20 MG TABLET: 20 | 30 days supply | Qty: 30 | Fill #0

## 2019-02-09 MED FILL — LEVOTHYROXINE 100 MCG TAB: 100 | 30 days supply | Qty: 30 | Fill #0

## 2019-02-10 ENCOUNTER — Other Ambulatory Visit: Payer: Self-pay | Admitting: Family Medicine

## 2019-03-31 ENCOUNTER — Other Ambulatory Visit: Payer: Self-pay | Admitting: Family Medicine

## 2019-03-31 MED FILL — LEVOTHYROXINE 100 MCG TAB: 100 | 30 days supply | Qty: 30 | Fill #0

## 2019-03-31 MED FILL — LISINOPRIL 20 MG TABLET: 20 | 30 days supply | Qty: 30 | Fill #0

## 2019-04-13 ENCOUNTER — Other Ambulatory Visit (HOSPITAL_COMMUNITY)
Admission: RE | Admit: 2019-04-13 | Discharge: 2019-04-13 | Disposition: A | Discharge status: Home / Self Care | Payer: 59 | Source: Ambulatory Visit | Attending: Family Medicine | Admitting: Family Medicine

## 2019-04-13 ENCOUNTER — Encounter: Payer: Self-pay | Admitting: Family Medicine

## 2019-04-13 ENCOUNTER — Ambulatory Visit (INDEPENDENT_AMBULATORY_CARE_PROVIDER_SITE_OTHER): Payer: 59 | Admitting: Family Medicine

## 2019-04-13 VITALS — BP 122/64 | HR 84 | Temp 98.2°F | Ht 60.0 in | Wt 164.7 lb

## 2019-04-13 DIAGNOSIS — Q210 Ventricular septal defect: Secondary | ICD-10-CM

## 2019-04-13 DIAGNOSIS — Z0000 Encounter for general adult medical examination without abnormal findings: Secondary | ICD-10-CM

## 2019-04-13 DIAGNOSIS — E039 Hypothyroidism, unspecified: Secondary | ICD-10-CM

## 2019-04-13 DIAGNOSIS — I10 Essential (primary) hypertension: Secondary | ICD-10-CM

## 2019-04-13 LAB — LIPID PANEL
Cholesterol: 179 mg/dL (ref 0–200)
HDL: 42 mg/dL (ref >39.00)
LDL Cholesterol: 113 mg/dL — ABNORMAL HIGH (ref 0–99)
NonHDL: 137.08
Total CHOL/HDL Ratio: 4
Triglycerides: 119 mg/dL (ref 0.0–149.0)
VLDL: 23.8 mg/dL (ref 0.0–40.0)

## 2019-04-13 LAB — HEPATIC FUNCTION PANEL
ALT: 26 U/L (ref 0–35)
AST: 24 U/L (ref 0–37)
Albumin: 4 g/dL (ref 3.5–5.2)
Alkaline Phosphatase: 105 U/L (ref 39–117)
Bilirubin, Direct: 0.1 mg/dL (ref 0.0–0.3)
Total Bilirubin: 0.6 mg/dL (ref 0.2–1.2)
Total Protein: 6.7 g/dL (ref 6.0–8.3)

## 2019-04-13 LAB — CBC WITH DIFFERENTIAL/PLATELET
Basophils Absolute: 0.1 10*3/uL (ref 0.0–0.1)
Basophils Relative: 1.2 % (ref 0.0–3.0)
Eosinophils Absolute: 0.2 10*3/uL (ref 0.0–0.7)
Eosinophils Relative: 2.8 % (ref 0.0–5.0)
HCT: 39.2 % (ref 36.0–46.0)
Hemoglobin: 13.4 g/dL (ref 12.0–15.0)
Lymphocytes Relative: 19.9 % (ref 12.0–46.0)
Lymphs Abs: 1.5 10*3/uL (ref 0.7–4.0)
MCHC: 34.3 g/dL (ref 30.0–36.0)
MCV: 83 fl (ref 78.0–100.0)
Monocytes Absolute: 0.5 10*3/uL (ref 0.1–1.0)
Monocytes Relative: 7.3 % (ref 3.0–12.0)
Neutro Abs: 5.1 10*3/uL (ref 1.4–7.7)
Neutrophils Relative %: 68.8 % (ref 43.0–77.0)
Platelets: 283 10*3/uL (ref 150.0–400.0)
RBC: 4.72 Mil/uL (ref 3.87–5.11)
RDW: 14.8 % (ref 11.5–15.5)
WBC: 7.4 10*3/uL (ref 4.0–10.5)

## 2019-04-13 LAB — TSH: TSH: 4.7 u[IU]/mL — ABNORMAL HIGH (ref 0.35–4.50)

## 2019-04-13 LAB — BASIC METABOLIC PANEL
BUN: 9 mg/dL (ref 6–23)
CO2: 30 mEq/L (ref 19–32)
Calcium: 9.4 mg/dL (ref 8.4–10.5)
Chloride: 104 mEq/L (ref 96–112)
Creatinine, Ser: 0.91 mg/dL (ref 0.40–1.20)
GFR: 62.06 mL/min (ref >60.00)
Glucose, Bld: 92 mg/dL (ref 70–99)
Potassium: 4.1 mEq/L (ref 3.5–5.1)
Sodium: 141 mEq/L (ref 135–145)

## 2019-04-13 MED ORDER — LEVOTHYROXINE SODIUM 100 MCG PO TABS: 100.0000 ug | ORAL_TABLET | Freq: Every day | ORAL | 3 refills | Status: DC

## 2019-04-13 MED ORDER — LISINOPRIL 20 MG PO TABS: ORAL_TABLET | ORAL | 3 refills | Status: DC

## 2019-04-13 NOTE — Patient Instructions (Signed)

## 2019-04-13 NOTE — Progress Notes (Signed)
Subjective:     Patient ID: Brianna Jackson, female   DOB: 1955/03/13, 64 y.o.   MRN: [id number]  HPI   [name] is seen for physical exam.  She has history of hypothyroidism and hypertension as well as ventricular septal defect.  She has not had echocardiogram in years.  Denies any palpitations, chest pains, dizziness, or syncope.  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  She is excited about that.  She had Covid infection back in October.  She had mild symptoms.  Has now been fully vaccinated.  Needs refills on her regular medications.  Health maintenance reviewed  -Mammogram was done back in November -Due for repeat colonoscopy later this year -Vaccinations up-to-date -Needs follow-up Pap smear  Past Medical History:  Diagnosis Date  . Allergy    seasonal  . Chicken pox   . GERD (gastroesophageal reflux disease)    not diagnosed  . Heart murmur    VSD, ECHO 2010  . History of MRSA infection 2010, 2012  . Hyperlipidemia   . Hypertension   . 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 Thyroid disease    hypothyroid  . Urine incontinence   . UTI (urinary tract infection)    Past Surgical History:  Procedure Laterality Date  . COLONOSCOPY  2006  . MOLE REMOVAL  2008  . MRSA  2010, 2012   absess  . [place]  . TONSILLECTOMY      reports that she has never smoked. She has never used smokeless tobacco. She reports current alcohol use. She reports that she does not use drugs. family history includes Alcohol abuse in her father; Cancer in her mother; Cirrhosis in her father. Allergies  Allergen Reactions  . Other Hives    Nuts   . Wheat     Gluten intollerance   Wt Readings from Last 3 Encounters:  04/13/19 164 lb 11.2 oz (74.7 kg)  02/26/18 164 lb 1.6 oz (74.4 kg)  02/19/17 167 lb (75.8 kg)     Review of Systems  Constitutional: Negative for activity change, appetite change, fatigue, fever and  unexpected weight change.  HENT: Negative for ear pain, hearing loss, sore throat and trouble swallowing.   Eyes: Negative for visual disturbance.  Respiratory: Negative for cough and shortness of breath.   Cardiovascular: Negative for chest pain and palpitations.  Gastrointestinal: Negative for abdominal pain, blood in stool, constipation and diarrhea.  Genitourinary: Negative for dysuria and hematuria.  Musculoskeletal: Positive for arthralgias. Negative for back pain and myalgias.  Skin: Negative for rash.  Neurological: Negative for dizziness, syncope and headaches.  Hematological: Negative for adenopathy.  Psychiatric/Behavioral: Negative for confusion and dysphoric mood.       Objective:   Physical Exam Constitutional:      Appearance: She is well-developed.  HENT:     Head: Normocephalic and atraumatic.  Eyes:     Pupils: Pupils are equal, round, and reactive to light.  Neck:     Thyroid: No thyromegaly.  Cardiovascular:     Rate and Rhythm: Normal rate and regular rhythm.     Heart sounds: Murmur present.     Comments: She has mild systolic blowing murmur left sternal border and also heard right upper sternal border Pulmonary:     Effort: No respiratory distress.     Breath sounds: Normal breath sounds. No wheezing or rales.  Abdominal:  General: Bowel sounds are normal. There is no distension.     Palpations: Abdomen is soft. There is no mass.     Tenderness: There is no abdominal tenderness. There is no guarding or rebound.  Genitourinary:    Comments: Normal external genitalia.  Vaginal mucosa appears normal.  Cervix appears normal.  Pap smear obtained with spatula and cytobroom.  Patient tolerated well.  Bimanual exam reveals no adnexal masses and no adnexal tenderness Musculoskeletal:        General: Normal range of motion.     Cervical back: Normal range of motion and neck supple.  Lymphadenopathy:     Cervical: No cervical adenopathy.  Skin:    Findings:  No rash.  Neurological:     Mental Status: She is alert and oriented to person, place, and time.     Cranial Nerves: No cranial nerve deficit.     Deep Tendon Reflexes: Reflexes normal.  Psychiatric:        Behavior: Behavior normal.        Thought Content: Thought content normal.        Judgment: Judgment normal.        Assessment:     Physical exam.  She has hypertension which is stable and controlled.  She has hypothyroidism and needs follow-up TSH.  History of VSD with no recent echocardiogram    Plan:     -Pap smear obtained as above.  She has low risk for abnormality -Obtain follow-up screening labs -She will be due for repeat colonoscopy later this year -Set up echocardiogram -Pneumovax by age 4.  Her other vaccines are up-to-date  Eulas Post MD [facility] at [facility]

## 2019-04-14 ENCOUNTER — Other Ambulatory Visit: Payer: Self-pay

## 2019-04-14 ENCOUNTER — Encounter: Payer: Self-pay | Admitting: Family Medicine

## 2019-04-14 DIAGNOSIS — E039 Hypothyroidism, unspecified: Secondary | ICD-10-CM

## 2019-04-14 LAB — CYTOLOGY - PAP: Diagnosis: NEGATIVE

## 2019-04-17 ENCOUNTER — Telehealth: Payer: Self-pay | Admitting: Family Medicine

## 2019-05-01 MED FILL — LISINOPRIL 20 MG TABLET: 20 | 90 days supply | Qty: 90 | Fill #0

## 2019-05-01 MED FILL — LEVOTHYROXINE 100 MCG TAB: 100 | 90 days supply | Qty: 90 | Fill #0

## 2019-05-05 ENCOUNTER — Other Ambulatory Visit: Payer: Self-pay

## 2019-05-05 ENCOUNTER — Ambulatory Visit (HOSPITAL_COMMUNITY): Payer: 59 | Attending: Cardiovascular Disease

## 2019-05-05 DIAGNOSIS — Q210 Ventricular septal defect: Secondary | ICD-10-CM | POA: Insufficient documentation

## 2019-07-28 MED FILL — LISINOPRIL 20 MG TABLET: 20 | 90 days supply | Qty: 90 | Fill #1

## 2019-07-28 MED FILL — LEVOTHYROXINE 100 MCG TAB: 100 | 90 days supply | Qty: 90 | Fill #1

## 2019-10-31 MED FILL — LISINOPRIL 20 MG TABLET: 20 | 90 days supply | Qty: 90 | Fill #2

## 2019-10-31 MED FILL — LEVOTHYROXINE 100 MCG TAB: 100 | 90 days supply | Qty: 90 | Fill #2

## 2019-11-14 ENCOUNTER — Other Ambulatory Visit: Payer: Self-pay

## 2019-11-14 ENCOUNTER — Emergency Department (HOSPITAL_BASED_OUTPATIENT_CLINIC_OR_DEPARTMENT_OTHER): Payer: Medicare Other

## 2019-11-14 ENCOUNTER — Emergency Department (HOSPITAL_BASED_OUTPATIENT_CLINIC_OR_DEPARTMENT_OTHER)
Admission: EM | Admit: 2019-11-14 | Discharge: 2019-11-14 | Disposition: A | Discharge status: Home / Self Care | Payer: Medicare Other | Attending: Emergency Medicine | Admitting: Emergency Medicine

## 2019-11-14 ENCOUNTER — Encounter (HOSPITAL_BASED_OUTPATIENT_CLINIC_OR_DEPARTMENT_OTHER): Payer: Self-pay

## 2019-11-14 DIAGNOSIS — Z79890 Hormone replacement therapy (postmenopausal): Secondary | ICD-10-CM | POA: Diagnosis not present

## 2019-11-14 DIAGNOSIS — Z79899 Other long term (current) drug therapy: Secondary | ICD-10-CM | POA: Diagnosis not present

## 2019-11-14 DIAGNOSIS — I10 Essential (primary) hypertension: Secondary | ICD-10-CM | POA: Insufficient documentation

## 2019-11-14 DIAGNOSIS — R10816 Epigastric abdominal tenderness: Secondary | ICD-10-CM | POA: Diagnosis not present

## 2019-11-14 DIAGNOSIS — R079 Chest pain, unspecified: Secondary | ICD-10-CM | POA: Diagnosis not present

## 2019-11-14 DIAGNOSIS — E039 Hypothyroidism, unspecified: Secondary | ICD-10-CM | POA: Insufficient documentation

## 2019-11-14 DIAGNOSIS — R12 Heartburn: Secondary | ICD-10-CM

## 2019-11-14 LAB — HEPATIC FUNCTION PANEL
ALT: 20 U/L (ref 0–44)
AST: 25 U/L (ref 15–41)
Albumin: 4.1 g/dL (ref 3.5–5.0)
Alkaline Phosphatase: 68 U/L (ref 38–126)
Bilirubin, Direct: 0.1 mg/dL (ref 0.0–0.2)
Indirect Bilirubin: 0.8 mg/dL (ref 0.3–0.9)
Total Bilirubin: 0.9 mg/dL (ref 0.3–1.2)
Total Protein: 7.1 g/dL (ref 6.5–8.1)

## 2019-11-14 LAB — CBC WITH DIFFERENTIAL/PLATELET
Abs Immature Granulocytes: 0.02 10*3/uL (ref 0.00–0.07)
Basophils Absolute: 0.1 10*3/uL (ref 0.0–0.1)
Basophils Relative: 1 %
Eosinophils Absolute: 0.2 10*3/uL (ref 0.0–0.5)
Eosinophils Relative: 2 %
HCT: 42.4 % (ref 36.0–46.0)
Hemoglobin: 14.8 g/dL (ref 12.0–15.0)
Immature Granulocytes: 0 %
Lymphocytes Relative: 19 %
Lymphs Abs: 1.3 10*3/uL (ref 0.7–4.0)
MCH: 29.1 pg (ref 26.0–34.0)
MCHC: 34.9 g/dL (ref 30.0–36.0)
MCV: 83.3 fL (ref 80.0–100.0)
Monocytes Absolute: 0.5 10*3/uL (ref 0.1–1.0)
Monocytes Relative: 8 %
Neutro Abs: 5 10*3/uL (ref 1.7–7.7)
Neutrophils Relative %: 70 %
Platelets: 258 10*3/uL (ref 150–400)
RBC: 5.09 MIL/uL (ref 3.87–5.11)
RDW: 13.4 % (ref 11.5–15.5)
WBC: 7.1 10*3/uL (ref 4.0–10.5)
nRBC: 0 % (ref 0.0–0.2)

## 2019-11-14 LAB — BASIC METABOLIC PANEL
Anion gap: 10 (ref 5–15)
BUN: 10 mg/dL (ref 8–23)
CO2: 22 mmol/L (ref 22–32)
Calcium: 9 mg/dL (ref 8.9–10.3)
Chloride: 107 mmol/L (ref 98–111)
Creatinine, Ser: 0.88 mg/dL (ref 0.44–1.00)
GFR, Estimated: >60 mL/min (ref >60)
Glucose, Bld: 103 mg/dL — ABNORMAL HIGH (ref 70–99)
Potassium: 3.6 mmol/L (ref 3.5–5.1)
Sodium: 139 mmol/L (ref 135–145)

## 2019-11-14 LAB — TROPONIN I (HIGH SENSITIVITY): Troponin I (High Sensitivity): 4 ng/L (ref <18)

## 2019-11-14 LAB — LIPASE, BLOOD: Lipase: 29 U/L (ref 11–51)

## 2019-11-14 MED ORDER — SUCRALFATE 1 G PO TABS: 1.0000 g | ORAL_TABLET | Freq: Three times a day (TID) | ORAL | 0 refills | Status: DC

## 2019-11-14 MED ORDER — ALUM & MAG HYDROXIDE-SIMETH 200-200-20 MG/5ML PO SUSP
30.0000 mL | Freq: Once | ORAL | Status: AC
  Administered 2019-11-14: 30 mL via ORAL
  Filled 2019-11-14: qty 30

## 2019-11-14 MED ORDER — PANTOPRAZOLE SODIUM 20 MG PO TBEC: 20.0000 mg | DELAYED_RELEASE_TABLET | Freq: Every day | ORAL | 0 refills | Status: DC

## 2019-11-14 MED ORDER — LIDOCAINE VISCOUS HCL 2 % MT SOLN
15.0000 mL | Freq: Once | OROMUCOSAL | Status: AC
  Administered 2019-11-14: 15 mL via ORAL
  Filled 2019-11-14: qty 15

## 2019-11-14 NOTE — ED Triage Notes (Signed)
Pt arrives with c/o burning CP starting last night.

## 2019-11-14 NOTE — ED Provider Notes (Signed)
[facility] EMERGENCY DEPARTMENT Provider Note   CSN: [id number] Arrival date & time: 11/14/19  1133     History Chief Complaint  Patient presents with  . Chest Pain    Brianna Jackson is a 64 y.o. female.  The history is provided by the patient.  Chest Pain Pain location:  Epigastric Pain quality: burning   Pain radiates to:  Does not radiate Pain severity:  Mild Onset quality:  Gradual Timing:  Intermittent Progression:  Improving Chronicity:  New Context: eating   Relieved by:  Nothing Worsened by:  Nothing Associated symptoms: no abdominal pain, no back pain, no cough, no fever, no nausea, no palpitations, no shortness of breath and no vomiting        Past Medical History:  Diagnosis Date  . Allergy    seasonal  . Chicken pox   . GERD (gastroesophageal reflux disease)    not diagnosed  . Heart murmur    VSD, ECHO 2010  . History of MRSA infection 2010, 2012  . Hyperlipidemia   . Hypertension   . 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 Thyroid disease    hypothyroid  . Urine incontinence   . UTI (urinary tract infection)     Patient Active Problem List   Diagnosis Date Noted  . Obesity (BMI 30-39.9) 05/11/2013  . Acute bronchitis 11/02/2011  . Hypothyroid 03/12/2011  . Hypertension 03/12/2011  . Ventricular septal defect 03/12/2011  . Positive PPD, treated 03/12/2011    Past Surgical History:  Procedure Laterality Date  . COLONOSCOPY  2006  . MOLE REMOVAL  2008  . MRSA  2010, 2012   absess  . [place]  . TONSILLECTOMY       OB History   No obstetric history on file.     Family History  Problem Relation Age of Onset  . Cancer Mother        lung cancer-never found primary sight   . Alcohol abuse Father   . Cirrhosis Father   . Colon cancer Neg Hx   . Colon polyps Neg Hx   . Rectal cancer Neg Hx   . Stomach cancer Neg Hx   . Esophageal cancer Neg Hx   . Breast cancer Neg Hx     Social  History   Tobacco Use  . Smoking status: Never Smoker  . Smokeless tobacco: Never Used  Vaping Use  . Vaping Use: Never used  Substance Use Topics  . Alcohol use: Yes    Alcohol/week: 0.0 standard drinks    Comment: rare socially  . Drug use: No    Home Medications Prior to Admission medications   Medication Sig Start Date End Date Taking? Authorizing Provider  albuterol (PROAIR HFA) 108 (90 Base) MCG/ACT inhaler Inhale 1-2 puffs into the lungs every 4 (four) hours as needed for wheezing or shortness of breath. 02/19/17   Burchette, Alinda Sierras, MD  Calcium Carbonate-Vitamin D (CALCIUM-VITAMIN D) 500-200 MG-UNIT per tablet Take 1 tablet by mouth 2 (two) times daily with a meal.    [provider]  levothyroxine (SYNTHROID) 100 MCG tablet Take 1 tablet (100 mcg total) by mouth daily. 04/13/19   Burchette, Alinda Sierras, MD  lisinopril (ZESTRIL) 20 MG tablet TAKE 1 TABLET BY MOUTH ONCE A DAY 04/13/19   Burchette, Alinda Sierras, MD  vitamin B-12 (CYANOCOBALAMIN) 100 MCG tablet Take 50 mcg by mouth daily.    [provider]  Allergies    Other and Wheat  Review of Systems   Review of Systems  Constitutional: Negative for chills and fever.  HENT: Negative for ear pain and sore throat.   Eyes: Negative for pain and visual disturbance.  Respiratory: Negative for cough and shortness of breath.   Cardiovascular: Positive for chest pain. Negative for palpitations.  Gastrointestinal: Negative for abdominal pain, nausea and vomiting.  Genitourinary: Negative for dysuria and hematuria.  Musculoskeletal: Negative for arthralgias and back pain.  Skin: Negative for color change and rash.  Neurological: Negative for seizures and syncope.  All other systems reviewed and are negative.   Physical Exam Updated Vital Signs There were no vitals taken for this visit.  Physical Exam Vitals and nursing note reviewed.  Constitutional:      General: She is not in acute distress.    Appearance:  She is well-developed.  HENT:     Head: Normocephalic and atraumatic.  Eyes:     Conjunctiva/sclera: Conjunctivae normal.     Pupils: Pupils are equal, round, and reactive to light.  Cardiovascular:     Rate and Rhythm: Normal rate and regular rhythm.     Pulses:          Radial pulses are 2+ on the right side and 2+ on the left side.     Heart sounds: No murmur heard.   Pulmonary:     Effort: Pulmonary effort is normal. No respiratory distress.     Breath sounds: Normal breath sounds. No decreased breath sounds or wheezing.  Abdominal:     Palpations: Abdomen is soft.     Tenderness: There is abdominal tenderness (epigastric).  Musculoskeletal:        General: Normal range of motion.     Cervical back: Normal range of motion and neck supple.     Right lower leg: No edema.     Left lower leg: No edema.  Skin:    General: Skin is warm and dry.     Capillary Refill: Capillary refill takes less than 2 seconds.  Neurological:     General: No focal deficit present.     Mental Status: She is alert.     ED Results / Procedures / Treatments   Labs (all labs ordered are listed, but only abnormal results are displayed) Labs Reviewed - No data to display  EKG EKG Interpretation  Date/Time:  Saturday November 14 2019 11:44:28 EDT Ventricular Rate:  90 PR Interval:    QRS Duration: 94 QT Interval:  360 QTC Calculation: 441 R Axis:   -18 Text Interpretation: Sinus rhythm Probable left atrial enlargement Borderline left axis deviation Baseline wander in lead(s) I Confirmed by Lennice Sites [phone]) on 11/14/2019 11:47:56 AM   Radiology No results found.  Procedures Procedures (including critical care time)  Medications Ordered in ED Medications - No data to display  ED Course  I have reviewed the triage vital signs and the nursing notes.  Pertinent labs & imaging results that were available during my care of the patient were reviewed by me and considered in my medical  decision making (see chart for details).    MDM Rules/Calculators/A&P                          Brianna Jackson is a 64 year old female with history of hypertension, high cholesterol presents to the ED with chest pain.  Unremarkable vitals.  No fever.  Has had some intermittent burning  chest pain since last night.  Suspects may be related to food.  No severe pain currently.  No diaphoresis, no radiation of pain.  No significant cardiac history.  EKG shows sinus rhythm.  No ischemic changes.  No shortness of breath.  No PE risk factors and doubt PE.  No infectious symptoms.  Overall suspect GI type pain.  Pain is mostly in the epigastric region on exam as well.  Denies any nausea or vomiting or diarrhea.  Will check lipase and hepatobiliary enzymes to evaluate for pancreatitis or referred gallbladder pain.  Will check troponin, chest x-ray for cardiac work-up.  Patient to be given GI cocktail.  Possibly gastritis.  Troponin normal.  Pain completely resolved with GI cocktail.  Suspect GI related pain.  No significant leukocytosis, anemia, electrolyte abnormality.  Lipase is normal doubt pancreatitis.  Gallbladder and liver enzymes within normal limits and doubt hepatobiliary process such as cholecystitis.  Will prescribe Protonix and Carafate.  Given return precautions.  Recommend follow-up with primary care doctor.  Discharged in good condition.  This chart was dictated using voice recognition software.  Despite best efforts to proofread,  errors can occur which can change the documentation meaning.    Final Clinical Impression(s) / ED Diagnoses Final diagnoses:  None    Rx / DC Orders ED Discharge Orders    None       Lennice Sites, DO 11/14/19 1241

## 2019-11-17 ENCOUNTER — Ambulatory Visit (INDEPENDENT_AMBULATORY_CARE_PROVIDER_SITE_OTHER): Payer: Medicare Other | Admitting: Family Medicine

## 2019-11-17 ENCOUNTER — Encounter: Payer: Self-pay | Admitting: Family Medicine

## 2019-11-17 ENCOUNTER — Other Ambulatory Visit: Payer: Self-pay

## 2019-11-17 VITALS — BP 120/80 | HR 74 | Ht 60.0 in | Wt 158.4 lb

## 2019-11-17 DIAGNOSIS — R079 Chest pain, unspecified: Secondary | ICD-10-CM

## 2019-11-17 NOTE — Progress Notes (Signed)
Established Patient Office Visit  Subjective:  Patient ID: Brianna Jackson, female    DOB: 1955/04/08  Age: 64 y.o. MRN: [id number]  CC:  Chief Complaint  Patient presents with  . Follow-up    follow up from visit from med center , having upper gi issues     HPI KELEIGH KAZEE presents for ER follow-up for chest pain.  She presented to the ER on the 16th with some chest pain intermittently at rest.  Location was substernal.  She had some sensation of pressure but occasional burning as well.  No recent exertional symptoms.  No dyspnea.  No cough or fever.  She had lab work which was unremarkable.  Negative troponins.  EKG reviewed independently and showed sinus rhythm with no acute ST-T changes.  Patient received GI cocktail which did seem to improve her symptoms.  She was discharged on Protonix and Carafate which she just started on.  She does have intermittent GERD symptoms but is not clear these were GERD symptoms at the time.  Chest x-ray showed mild cardiomegaly.  Patient has known history of VSD.  She had echocardiogram last spring which did not show any acute findings.  Patient is non-smoker.  She does have hypertension which is treated.  She has had some recent elevated readings occasionally including in the ER but well controlled today.  No history of diabetes.  No family history of premature CAD except for possibly one grandparent  Past Medical History:  Diagnosis Date  . Allergy    seasonal  . Chicken pox   . GERD (gastroesophageal reflux disease)    not diagnosed  . Heart murmur    VSD, ECHO 2010  . History of MRSA infection 2010, 2012  . Hyperlipidemia   . Hypertension   . 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 Thyroid disease    hypothyroid  . Urine incontinence   . UTI (urinary tract infection)     Past Surgical History:  Procedure Laterality Date  . COLONOSCOPY  2006  . MOLE REMOVAL  2008  . MRSA  2010, 2012   absess  . [place]    . TONSILLECTOMY      Family History  Problem Relation Age of Onset  . Cancer Mother        lung cancer-never found primary sight   . Alcohol abuse Father   . Cirrhosis Father   . Colon cancer Neg Hx   . Colon polyps Neg Hx   . Rectal cancer Neg Hx   . Stomach cancer Neg Hx   . Esophageal cancer Neg Hx   . Breast cancer Neg Hx     Social History   Socioeconomic History  . Marital status: Divorced    Spouse name: Not on file  . Number of children: Not on file  . Years of education: Not on file  . Highest education level: Not on file  Occupational History  . Not on file  Tobacco Use  . Smoking status: Never Smoker  . Smokeless tobacco: Never Used  Vaping Use  . Vaping Use: Never used  Substance and Sexual Activity  . Alcohol use: Yes    Alcohol/week: 0.0 standard drinks    Comment: rare socially  . Drug use: No  . Sexual activity: Not on file  Other Topics Concern  . Not on file  Social History Narrative  . Not on file   Social Determinants of Health  Financial Resource Strain:   . Difficulty of Paying Living Expenses: Not on file  Food Insecurity:   . Worried About Charity fundraiser in the Last Year: Not on file  . Ran Out of Food in the Last Year: Not on file  Transportation Needs:   . Lack of Transportation (Medical): Not on file  . Lack of Transportation (Non-Medical): Not on file  Physical Activity:   . Days of Exercise per Week: Not on file  . Minutes of Exercise per Session: Not on file  Stress:   . Feeling of Stress : Not on file  Social Connections:   . Frequency of Communication with Friends and Family: Not on file  . Frequency of Social Gatherings with Friends and Family: Not on file  . Attends Religious Services: Not on file  . Active Member of Clubs or Organizations: Not on file  . Attends Archivist Meetings: Not on file  . Marital Status: Not on file  Intimate Partner Violence:   . Fear of Current or Ex-Partner: Not on file   . Emotionally Abused: Not on file  . Physically Abused: Not on file  . Sexually Abused: Not on file    Outpatient Medications Prior to Visit  Medication Sig Dispense Refill  . albuterol (PROAIR HFA) 108 (90 Base) MCG/ACT inhaler Inhale 1-2 puffs into the lungs every 4 (four) hours as needed for wheezing or shortness of breath. 1 Inhaler 2  . Calcium Carbonate-Vitamin D (CALCIUM-VITAMIN D) 500-200 MG-UNIT per tablet Take 1 tablet by mouth 2 (two) times daily with a meal.    . levothyroxine (SYNTHROID) 100 MCG tablet Take 1 tablet (100 mcg total) by mouth daily. 90 tablet 3  . lisinopril (ZESTRIL) 20 MG tablet TAKE 1 TABLET BY MOUTH ONCE A DAY 90 tablet 3  . pantoprazole (PROTONIX) 20 MG tablet Take 1 tablet (20 mg total) by mouth daily. 30 tablet 0  . sucralfate (CARAFATE) 1 g tablet Take 1 tablet (1 g total) by mouth 4 (four) times daily -  with meals and at bedtime for 14 days. 56 tablet 0  . vitamin B-12 (CYANOCOBALAMIN) 100 MCG tablet Take 50 mcg by mouth daily.     No facility-administered medications prior to visit.    Allergies  Allergen Reactions  . Other Hives    Nuts   . Wheat     Gluten intollerance    ROS Review of Systems  Constitutional: Negative for appetite change, chills, fever and unexpected weight change.  Respiratory: Positive for chest tightness. Negative for cough and shortness of breath.   Cardiovascular: Negative for palpitations and leg swelling.  Gastrointestinal: Negative for abdominal pain.  Neurological: Negative for dizziness and syncope.      Objective:    Physical Exam Vitals reviewed.  Constitutional:      Appearance: Normal appearance.  Cardiovascular:     Rate and Rhythm: Normal rate and regular rhythm.     Heart sounds: Murmur heard.      Comments: She has prominent systolic murmur heard left sternal border and right sternal border with known history of VSD Pulmonary:     Effort: Pulmonary effort is normal.     Breath sounds:  Normal breath sounds. No wheezing or rales.  Musculoskeletal:     Cervical back: Neck supple.  Neurological:     Mental Status: She is alert.     BP 120/80 (BP Location: Left Arm, Patient Position: Sitting, Cuff Size: Normal)   Pulse 74  Ht 5' (1.524 m)   Wt 158 lb 6.4 oz (71.8 kg)   SpO2 98%   BMI 30.94 kg/m  Wt Readings from Last 3 Encounters:  11/17/19 158 lb 6.4 oz (71.8 kg)  11/14/19 157 lb (71.2 kg)  04/13/19 164 lb 11.2 oz (74.7 kg)     Health Maintenance Due  Topic Date Due  . COVID-19 Vaccine (1) Never done  . HIV Screening  Never done  . DEXA SCAN  Never done  . PNA vac Low Risk Adult (1 of 2 - PCV13) Never done  . INFLUENZA VACCINE  08/30/2019  . TETANUS/TDAP  11/09/2019    There are no preventive care reminders to display for this patient.  Lab Results  Component Value Date   TSH 4.70 (H) 04/13/2019   Lab Results  Component Value Date   WBC 7.1 11/14/2019   HGB 14.8 11/14/2019   HCT 42.4 11/14/2019   MCV 83.3 11/14/2019   PLT 258 11/14/2019   Lab Results  Component Value Date   NA 139 11/14/2019   K 3.6 11/14/2019   CO2 22 11/14/2019   GLUCOSE 103 (H) 11/14/2019   BUN 10 11/14/2019   CREATININE 0.88 11/14/2019   BILITOT 0.9 11/14/2019   ALKPHOS 68 11/14/2019   AST 25 11/14/2019   ALT 20 11/14/2019   PROT 7.1 11/14/2019   ALBUMIN 4.1 11/14/2019   CALCIUM 9.0 11/14/2019   ANIONGAP 10 11/14/2019   GFR 62.06 04/13/2019   Lab Results  Component Value Date   CHOL 179 04/13/2019   Lab Results  Component Value Date   HDL 42.00 04/13/2019   Lab Results  Component Value Date   LDLCALC 113 (H) 04/13/2019   Lab Results  Component Value Date   TRIG 119.0 04/13/2019   Lab Results  Component Value Date   CHOLHDL 4 04/13/2019   No results found for: HGBA1C    Assessment & Plan:   Problem List Items Addressed This Visit    None    Visit Diagnoses    Chest pain, unspecified type    -  Primary   Relevant Orders   Ambulatory  referral to Cardiology    Patient has had some recent intermittent episodes of atypical chest pain.  History of known VSD.  Although this is probably GI related recommend cardiology referral for further evaluation.  -Referral placed -Go ahead and start Protonix and Carafate at this time but will aim for short-term use only with PPI.  Consider transitioning to H2 blocker such as Pepcid soon  No orders of the defined types were placed in this encounter.   Follow-up: No follow-ups on file.    Carolann Littler, MD

## 2019-11-17 NOTE — Patient Instructions (Signed)

## 2019-11-20 ENCOUNTER — Other Ambulatory Visit (HOSPITAL_BASED_OUTPATIENT_CLINIC_OR_DEPARTMENT_OTHER): Payer: Self-pay | Admitting: Internal Medicine

## 2019-11-20 ENCOUNTER — Ambulatory Visit: Payer: Medicare Other | Attending: Internal Medicine

## 2019-11-20 DIAGNOSIS — Z23 Encounter for immunization: Secondary | ICD-10-CM

## 2019-11-20 NOTE — Progress Notes (Signed)
   Covid-19 Vaccination Clinic  Name:  Brianna Jackson    MRN: [id number] DOB: 1955-04-02  11/20/2019  Brianna Jackson was observed post Covid-19 immunization for 15 minutes without incident. She was provided with Vaccine Information Sheet and instruction to access the V-Safe system.  Vaccinated by [facility]  Brianna Jackson was instructed to call 911 with any severe reactions post vaccine: Marland Kitchen Difficulty breathing  . Swelling of face and throat  . A fast heartbeat  . A bad rash all over body  . Dizziness and weakness

## 2019-11-23 ENCOUNTER — Other Ambulatory Visit: Payer: Self-pay

## 2019-11-23 ENCOUNTER — Encounter: Payer: Self-pay | Admitting: Interventional Cardiology

## 2019-11-23 ENCOUNTER — Ambulatory Visit (INDEPENDENT_AMBULATORY_CARE_PROVIDER_SITE_OTHER): Payer: Medicare Other | Admitting: Interventional Cardiology

## 2019-11-23 ENCOUNTER — Other Ambulatory Visit: Payer: Self-pay | Admitting: Interventional Cardiology

## 2019-11-23 VITALS — BP 128/90 | HR 79 | Ht 60.0 in | Wt 155.0 lb

## 2019-11-23 DIAGNOSIS — R072 Precordial pain: Secondary | ICD-10-CM | POA: Diagnosis not present

## 2019-11-23 DIAGNOSIS — I10 Essential (primary) hypertension: Secondary | ICD-10-CM

## 2019-11-23 DIAGNOSIS — Q210 Ventricular septal defect: Secondary | ICD-10-CM | POA: Diagnosis not present

## 2019-11-23 MED ORDER — METOPROLOL TARTRATE 100 MG PO TABS: ORAL_TABLET | ORAL | 0 refills | Status: DC

## 2019-11-23 MED FILL — METOPROLOL TARTRATE 100 MG: 100 | 1 days supply | Qty: 1 | Fill #0

## 2019-11-23 NOTE — Patient Instructions (Signed)
Medication Instructions:  Your physician recommends that you continue on your current medications as directed. Please refer to the Current Medication list given to you today.  *If you need a refill on your cardiac medications before your next appointment, please call your pharmacy*   Lab Work: None  If you have labs (blood work) drawn today and your tests are completely normal, you will receive your results only by: Marland Kitchen MyChart Message (if you have MyChart) OR . A paper copy in the mail If you have any lab test that is abnormal or we need to change your treatment, we will call you to review the results.   Testing/Procedures: Your physician has requested that you have cardiac CT.  Follow-Up: Based on results   Other Instructions Your cardiac CT will be scheduled at one of the below locations:   [facility] [address] [place], [place] [address] [phone]  [place] [address] [place], [place] [address] [phone]  If scheduled at [facility], please arrive at the [facility] main entrance of [facility] 30 minutes prior to test start time. Proceed to the [facility] Radiology Department (first floor) to check-in and test prep.  If scheduled at [facility], please arrive 15 mins early for check-in and test prep.  Please follow these instructions carefully (unless otherwise directed):   On the Night Before the Test: . Be sure to Drink plenty of water. . Do not consume any caffeinated/decaffeinated beverages or chocolate 12 hours prior to your test. . Do not take any antihistamines 12 hours prior to your test.   On the Day of the Test: . Drink plenty of water. Do not drink any water within one hour of the test. . Do not eat any food 4 hours prior to the test. . You may take your regular medications prior to the test.  . Take metoprolol (Lopressor) 100  MG two hours prior to test. . FEMALES- please wear underwire-free bra if available      After the Test: . Drink plenty of water. . After receiving IV contrast, you may experience a mild flushed feeling. This is normal. . On occasion, you may experience a mild rash up to 24 hours after the test. This is not dangerous. If this occurs, you can take Benadryl 25 mg and increase your fluid intake. . If you experience trouble breathing, this can be serious. If it is severe call 911 IMMEDIATELY. If it is mild, please call our office.   Once we have confirmed authorization from your insurance company, we will call you to set up a date and time for your test. Based on how quickly your insurance processes prior authorizations requests, please allow up to 4 weeks to be contacted for scheduling your Cardiac CT appointment. Be advised that routine Cardiac CT appointments could be scheduled as many as 8 weeks after your provider has ordered it.  For non-scheduling related questions, please contact the cardiac imaging nurse navigator should you have any questions/concerns: Marchia Bond, Cardiac Imaging Nurse Navigator Burley Saver, Interim Cardiac Imaging Nurse [place] [facility] Direct Office Dial: [phone]   For scheduling needs, including cancellations and rescheduling, please call Vivien Rota at [phone], option 3.

## 2019-11-23 NOTE — Progress Notes (Signed)
Cardiology Office Note   Date:  11/23/2019   ID:  Brianna Jackson, DOB 11-26-1955, MRN [id number]  PCP:  Brianna Post, MD    No chief complaint on file.  Chest pain  Wt Readings from Last 3 Encounters:  11/23/19 155 lb (70.3 kg)  11/17/19 158 lb 6.4 oz (71.8 kg)  11/14/19 157 lb (71.2 kg)       History of Present Illness: Brianna Jackson is a 64 y.o. female who is being seen today for the evaluation of palpitations (HR120), chest tightness at the request of Burchette, Alinda Sierras, MD.  She has a VSD from birth that has been monitored, no intervention.    4/21 echo showed small VSD with normal LV function.    ER visit on 11/14/19: "with history of hypertension, high cholesterol presents to the ED with chest pain.  Unremarkable vitals.  No fever.  Has had some intermittent burning chest pain since last night.  Suspects may be related to food.  No severe pain currently.  No diaphoresis, no radiation of pain.  No significant cardiac history.  EKG shows sinus rhythm.  No ischemic changes.  No shortness of breath.  No PE risk factors and doubt PE.  No infectious symptoms.  Overall suspect GI type pain.  Pain is mostly in the epigastric region on exam as well.  Denies any nausea or vomiting or diarrhea.  Will check lipase and hepatobiliary enzymes to evaluate for pancreatitis or referred gallbladder pain.  Will check troponin, chest x-ray for cardiac work-up.  Patient to be given GI cocktail.  Possibly gastritis.  Troponin normal.  Pain completely resolved with GI cocktail.  Suspect GI related pain.  No significant leukocytosis, anemia, electrolyte abnormality.  Lipase is normal doubt pancreatitis.  Gallbladder and liver enzymes within normal limits and doubt hepatobiliary process such as cholecystitis.  Will prescribe Protonix and Carafate.  Given return precautions.  Recommend follow-up with primary care doctor.  Discharged in good condition."  When she lived in [place], she had a  stress test many years ago.  Of late, she has been walking daily.  Stomach feels better after starting Protonix and sucralfate.    When she walks, she has a little discomfort in her chest. THis has persisted even after her trip to the ER.  She continues to try to walk in the hopes of getting into better shape. She has some DOE as well.  She has some fatigue as well.    She did have COVID a while back but did not require hospitalization.   Past Medical History:  Diagnosis Date  . Allergy    seasonal  . Chicken pox   . GERD (gastroesophageal reflux disease)    not diagnosed  . Heart murmur    VSD, ECHO 2010  . History of MRSA infection 2010, 2012  . Hyperlipidemia   . Hypertension   . 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 Thyroid disease    hypothyroid  . Urine incontinence   . UTI (urinary tract infection)     Past Surgical History:  Procedure Laterality Date  . COLONOSCOPY  2006  . MOLE REMOVAL  2008  . MRSA  2010, 2012   absess  . [place]  . TONSILLECTOMY       Current Outpatient Medications  Medication Sig Dispense Refill  . albuterol (PROAIR HFA) 108 (90 Base) MCG/ACT inhaler Inhale 1-2 puffs into the lungs every  4 (four) hours as needed for wheezing or shortness of breath. 1 Inhaler 2  . Calcium Carbonate-Vitamin D (CALCIUM-VITAMIN D) 500-200 MG-UNIT per tablet Take 1 tablet by mouth 2 (two) times daily with a meal.    . levothyroxine (SYNTHROID) 100 MCG tablet Take 1 tablet (100 mcg total) by mouth daily. 90 tablet 3  . lisinopril (ZESTRIL) 20 MG tablet TAKE 1 TABLET BY MOUTH ONCE A DAY 90 tablet 3  . pantoprazole (PROTONIX) 20 MG tablet Take 1 tablet (20 mg total) by mouth daily. 30 tablet 0  . sucralfate (CARAFATE) 1 g tablet Take 1 tablet (1 g total) by mouth 4 (four) times daily -  with meals and at bedtime for 14 days. 56 tablet 0  . vitamin B-12 (CYANOCOBALAMIN) 100 MCG tablet Take 50 mcg by mouth daily.     No current  facility-administered medications for this visit.    Allergies:   Other and Wheat    Social History:  The patient  reports that she has never smoked. She has never used smokeless tobacco. She reports current alcohol use. She reports that she does not use drugs.   Family History:  The patient's family history includes Alcohol abuse in her father; Cancer in her mother; Cirrhosis in her father.    ROS:  Please see the history of present illness.   Otherwise, review of systems are positive for GERD.   All other systems are reviewed and negative.    PHYSICAL EXAM: VS:  BP 128/90   Pulse 79   Ht 5' (1.524 m)   Wt 155 lb (70.3 kg)   SpO2 97%   BMI 30.27 kg/m  , BMI Body mass index is 30.27 kg/m. GEN: Well nourished, well developed, in no acute distress  HEENT: normal  Neck: no JVD, carotid bruits, or masses Cardiac: RRR; 3/6 systolic murmur, no rubs, or gallops,no edema  Respiratory:  clear to auscultation bilaterally, normal work of breathing GI: soft, nontender, nondistended, + BS MS: no deformity or atrophy  Skin: warm and dry, no rash Neuro:  Strength and sensation are intact Psych: euthymic mood, full affect   EKG:   The ekg ordered today demonstrates NSR, nonspecific ST changes   Recent Labs: 04/13/2019: TSH 4.70 11/14/2019: ALT 20; BUN 10; Creatinine, Ser 0.88; Hemoglobin 14.8; Platelets 258; Potassium 3.6; Sodium 139   Lipid Panel    Component Value Date/Time   CHOL 179 04/13/2019 1102   TRIG 119.0 04/13/2019 1102   HDL 42.00 04/13/2019 1102   CHOLHDL 4 04/13/2019 1102   VLDL 23.8 04/13/2019 1102   LDLCALC 113 (H) 04/13/2019 1102   LDLDIRECT 150.1 11/23/2011 1132     Other studies Reviewed: Additional studies/ records that were reviewed today with results demonstrating: 4/21 echo reviewed.   ASSESSMENT AND PLAN:  1. Precordial chest pain:  Some typical and atypical features.  Plan for coronary CTA.  Metoprolol 100 mg x 1 before scan.   2. VSD: Loud  murmur.  No evidence of RV failure. This is known to be small and has been followed for years.  Normal LV/RV function by most recent echo.  3. HTN: low salt diet.     Current medicines are reviewed at length with the patient today.  The patient concerns regarding her medicines were addressed.  The following changes have been made:  No change  Labs/ tests ordered today include:  No orders of the defined types were placed in this encounter.   Recommend 150 minutes/week of  aerobic exercise Low fat, low carb, high fiber diet recommended  Disposition:   FU based on cardiac CT   Signed, Larae Grooms, MD  11/23/2019 [facility] [place], [place], [place]  [address] Phone: [phone]; Fax: [phone]

## 2019-11-25 MED FILL — PFIZER-BIONTECH COVID-19 VA: 30 | 1 days supply | Qty: 0 | Fill #0

## 2019-11-26 ENCOUNTER — Other Ambulatory Visit: Payer: Self-pay

## 2019-11-26 ENCOUNTER — Other Ambulatory Visit: Payer: Medicare Other | Admitting: *Deleted

## 2019-11-26 DIAGNOSIS — R072 Precordial pain: Secondary | ICD-10-CM

## 2019-11-26 LAB — BASIC METABOLIC PANEL
BUN/Creatinine Ratio: 11 — ABNORMAL LOW (ref 12–28)
BUN: 11 mg/dL (ref 8–27)
CO2: 22 mmol/L (ref 20–29)
Calcium: 9.3 mg/dL (ref 8.7–10.3)
Chloride: 104 mmol/L (ref 96–106)
Creatinine, Ser: 0.97 mg/dL (ref 0.57–1.00)
GFR calc Af Amer: 71 mL/min/{1.73_m2} (ref >59)
GFR calc non Af Amer: 61 mL/min/{1.73_m2} (ref >59)
Glucose: 87 mg/dL (ref 65–99)
Potassium: 3.7 mmol/L (ref 3.5–5.2)
Sodium: 142 mmol/L (ref 134–144)

## 2019-12-03 DIAGNOSIS — Z1231 Encounter for screening mammogram for malignant neoplasm of breast: Secondary | ICD-10-CM

## 2019-12-08 ENCOUNTER — Telehealth (HOSPITAL_COMMUNITY): Payer: Self-pay | Admitting: Emergency Medicine

## 2019-12-08 NOTE — Telephone Encounter (Signed)
Reaching out to patient to offer assistance regarding upcoming cardiac imaging study; pt verbalizes understanding of appt date/time, parking situation and where to check in, pre-test NPO status and medications ordered, and verified current allergies; name and call back number provided for further questions should they arise Monque Haggar RN Navigator Cardiac Imaging [facility] [phone] office [phone] cell 

## 2019-12-09 ENCOUNTER — Other Ambulatory Visit: Payer: Self-pay

## 2019-12-09 ENCOUNTER — Ambulatory Visit (HOSPITAL_COMMUNITY)
Admission: RE | Admit: 2019-12-09 | Discharge: 2019-12-09 | Disposition: A | Discharge status: Home / Self Care | Payer: Medicare Other | Source: Ambulatory Visit | Attending: Interventional Cardiology | Admitting: Interventional Cardiology

## 2019-12-09 DIAGNOSIS — R072 Precordial pain: Secondary | ICD-10-CM | POA: Insufficient documentation

## 2019-12-09 MED ORDER — NITROGLYCERIN 0.4 MG SL SUBL
0.8000 mg | SUBLINGUAL_TABLET | Freq: Once | SUBLINGUAL | Status: AC
  Administered 2019-12-09: 0.8 mg via SUBLINGUAL

## 2019-12-09 MED ORDER — NITROGLYCERIN 0.4 MG SL SUBL
SUBLINGUAL_TABLET | SUBLINGUAL | Status: AC
  Filled 2019-12-09: qty 2

## 2019-12-09 MED ORDER — IOHEXOL 350 MG/ML SOLN
80.0000 mL | Freq: Once | INTRAVENOUS | Status: AC | PRN
  Administered 2019-12-09: 80 mL via INTRAVENOUS

## 2019-12-16 ENCOUNTER — Telehealth: Payer: Self-pay | Admitting: Family Medicine

## 2019-12-16 NOTE — Telephone Encounter (Signed)
Patient need her [facility] visit scheduled with proovider

## 2019-12-16 NOTE — Telephone Encounter (Signed)
Left message for patient to schedule Annual Wellness Visit.  Please schedule with Nurse Health Advisor Shannon Crews, RN at  [facility]  

## 2020-01-26 MED FILL — LEVOTHYROXINE 100 MCG TAB: 100 | 90 days supply | Qty: 90 | Fill #3

## 2020-01-26 MED FILL — LISINOPRIL 20 MG TABS: 20 | 90 days supply | Qty: 90 | Fill #3

## 2020-02-03 ENCOUNTER — Other Ambulatory Visit: Payer: Self-pay | Admitting: Family Medicine

## 2020-02-03 DIAGNOSIS — Z1231 Encounter for screening mammogram for malignant neoplasm of breast: Secondary | ICD-10-CM

## 2020-02-08 ENCOUNTER — Ambulatory Visit
Admission: RE | Admit: 2020-02-08 | Discharge: 2020-02-08 | Disposition: A | Discharge status: Home / Self Care | Payer: BLUE CROSS/BLUE SHIELD | Source: Ambulatory Visit

## 2020-02-08 ENCOUNTER — Other Ambulatory Visit: Payer: Self-pay

## 2020-02-08 DIAGNOSIS — Z1231 Encounter for screening mammogram for malignant neoplasm of breast: Secondary | ICD-10-CM

## 2020-03-16 ENCOUNTER — Encounter: Payer: Self-pay | Admitting: Gastroenterology

## 2020-04-13 ENCOUNTER — Other Ambulatory Visit: Payer: Self-pay | Admitting: Family Medicine

## 2020-04-13 ENCOUNTER — Encounter: Payer: Self-pay | Admitting: Family Medicine

## 2020-04-13 ENCOUNTER — Other Ambulatory Visit: Payer: Self-pay

## 2020-04-13 ENCOUNTER — Ambulatory Visit (INDEPENDENT_AMBULATORY_CARE_PROVIDER_SITE_OTHER): Payer: Medicare Other | Admitting: Family Medicine

## 2020-04-13 VITALS — BP 128/80 | HR 73 | Temp 98.3°F | Ht 60.0 in | Wt 142.3 lb

## 2020-04-13 DIAGNOSIS — I10 Essential (primary) hypertension: Secondary | ICD-10-CM | POA: Diagnosis not present

## 2020-04-13 DIAGNOSIS — Z780 Asymptomatic menopausal state: Secondary | ICD-10-CM

## 2020-04-13 DIAGNOSIS — E785 Hyperlipidemia, unspecified: Secondary | ICD-10-CM

## 2020-04-13 DIAGNOSIS — Z0000 Encounter for general adult medical examination without abnormal findings: Secondary | ICD-10-CM | POA: Diagnosis not present

## 2020-04-13 DIAGNOSIS — Z23 Encounter for immunization: Secondary | ICD-10-CM

## 2020-04-13 DIAGNOSIS — E039 Hypothyroidism, unspecified: Secondary | ICD-10-CM | POA: Diagnosis not present

## 2020-04-13 LAB — TSH: TSH: 1.12 u[IU]/mL (ref 0.35–4.50)

## 2020-04-13 LAB — HEPATIC FUNCTION PANEL
ALT: 13 U/L (ref 0–35)
AST: 20 U/L (ref 0–37)
Albumin: 4.1 g/dL (ref 3.5–5.2)
Alkaline Phosphatase: 76 U/L (ref 39–117)
Bilirubin, Direct: 0.1 mg/dL (ref 0.0–0.3)
Total Bilirubin: 0.7 mg/dL (ref 0.2–1.2)
Total Protein: 6.6 g/dL (ref 6.0–8.3)

## 2020-04-13 LAB — BASIC METABOLIC PANEL
BUN: 11 mg/dL (ref 6–23)
CO2: 29 mEq/L (ref 19–32)
Calcium: 9.7 mg/dL (ref 8.4–10.5)
Chloride: 106 mEq/L (ref 96–112)
Creatinine, Ser: 0.93 mg/dL (ref 0.40–1.20)
GFR: 64.31 mL/min (ref >60.00)
Glucose, Bld: 91 mg/dL (ref 70–99)
Potassium: 3.7 mEq/L (ref 3.5–5.1)
Sodium: 142 mEq/L (ref 135–145)

## 2020-04-13 LAB — LIPID PANEL
Cholesterol: 191 mg/dL (ref 0–200)
HDL: 53.4 mg/dL (ref >39.00)
LDL Cholesterol: 118 mg/dL — ABNORMAL HIGH (ref 0–99)
NonHDL: 137.19
Total CHOL/HDL Ratio: 4
Triglycerides: 96 mg/dL (ref 0.0–149.0)
VLDL: 19.2 mg/dL (ref 0.0–40.0)

## 2020-04-13 LAB — T4, FREE: Free T4: 1.38 ng/dL (ref 0.60–1.60)

## 2020-04-13 MED ORDER — LEVOTHYROXINE SODIUM 100 MCG PO TABS
100.0000 ug | ORAL_TABLET | Freq: Every day | ORAL | 3 refills | Status: DC
Start: 2020-04-13 — End: 2020-04-13

## 2020-04-13 MED ORDER — LISINOPRIL 20 MG PO TABS
ORAL_TABLET | ORAL | 3 refills | Status: DC
Start: 2020-04-13 — End: 2020-04-13

## 2020-04-13 NOTE — Patient Instructions (Signed)
Preventive Care 61 Years and Older, Female Preventive care refers to lifestyle choices and visits with your health care provider that can promote health and wellness. This includes:  A yearly physical exam. This is also called an annual wellness visit.  Regular dental and eye exams.  Immunizations.  Screening for certain conditions.  Healthy lifestyle choices, such as: ? Eating a healthy diet. ? Getting regular exercise. ? Not using drugs or products that contain nicotine and tobacco. ? Limiting alcohol use. What can I expect for my preventive care visit? Physical exam Your health care provider will check your:  Height and weight. These may be used to calculate your BMI (body mass index). BMI is a measurement that tells if you are at a healthy weight.  Heart rate and blood pressure.  Body temperature.  Skin for abnormal spots. Counseling Your health care provider may ask you questions about your:  Past medical problems.  Family's medical history.  Alcohol, tobacco, and drug use.  Emotional well-being.  Home life and relationship well-being.  Sexual activity.  Diet, exercise, and sleep habits.  History of falls.  Memory and ability to understand (cognition).  Work and work Statistician.  Pregnancy and menstrual history.  Access to firearms. What immunizations do I need? Vaccines are usually given at various ages, according to a schedule. Your health care provider will recommend vaccines for you based on your age, medical history, and lifestyle or other factors, such as travel or where you work.   What tests do I need? Blood tests  Lipid and cholesterol levels. These may be checked every 5 years, or more often depending on your overall health.  Hepatitis C test.  Hepatitis B test. Screening  Lung cancer screening. You may have this screening every year starting at age 74 if you have a 30-pack-year history of smoking and currently smoke or have quit within  the past 15 years.  Colorectal cancer screening. ? All adults should have this screening starting at age 44 and continuing until age 58. ? Your health care provider may recommend screening at age 2 if you are at increased risk. ? You will have tests every 1-10 years, depending on your results and the type of screening test.  Diabetes screening. ? This is done by checking your blood sugar (glucose) after you have not eaten for a while (fasting). ? You may have this done every 1-3 years.  Mammogram. ? This may be done every 1-2 years. ? Talk with your health care provider about how often you should have regular mammograms.  Abdominal aortic aneurysm (AAA) screening. You may need this if you are a current or former smoker.  BRCA-related cancer screening. This may be done if you have a family history of breast, ovarian, tubal, or peritoneal cancers. Other tests  STD (sexually transmitted disease) testing, if you are at risk.  Bone density scan. This is done to screen for osteoporosis. You may have this done starting at age 104. Talk with your health care provider about your test results, treatment options, and if necessary, the need for more tests. Follow these instructions at home: Eating and drinking  Eat a diet that includes fresh fruits and vegetables, whole grains, lean protein, and low-fat dairy products. Limit your intake of foods with high amounts of sugar, saturated fats, and salt.  Take vitamin and mineral supplements as recommended by your health care provider.  Do not drink alcohol if your health care provider tells you not to drink.  If you drink alcohol: ? Limit how much you have to 0-1 drink a day. ? Be aware of how much alcohol is in your drink. In the [place] one drink equals one 12 oz bottle of beer (355 mL), one 5 oz glass of wine (148 mL), or one 1 oz glass of hard liquor (44 mL).   Lifestyle  Take daily care of your teeth and gums. Brush your teeth every morning  and night with fluoride toothpaste. Floss one time each day.  Stay active. Exercise for at least 30 minutes 5 or more days each week.  Do not use any products that contain nicotine or tobacco, such as cigarettes, e-cigarettes, and chewing tobacco. If you need help quitting, ask your health care provider.  Do not use drugs.  If you are sexually active, practice safe sex. Use a condom or other form of protection in order to prevent STIs (sexually transmitted infections).  Talk with your health care provider about taking a low-dose aspirin or statin.  Find healthy ways to cope with stress, such as: ? Meditation, yoga, or listening to music. ? Journaling. ? Talking to a trusted person. ? Spending time with friends and family. Safety  Always wear your seat belt while driving or riding in a vehicle.  Do not drive: ? If you have been drinking alcohol. Do not ride with someone who has been drinking. ? When you are tired or distracted. ? While texting.  Wear a helmet and other protective equipment during sports activities.  If you have firearms in your house, make sure you follow all gun safety procedures. What's next?  Visit your health care provider once a year for an annual wellness visit.  Ask your health care provider how often you should have your eyes and teeth checked.  Stay up to date on all vaccines. This information is not intended to replace advice given to you by your health care provider. Make sure you discuss any questions you have with your health care provider. Document Revised: 01/06/2020 Document Reviewed: 01/09/2018 Elsevier Patient Education  2021 Elsevier Inc.  

## 2020-04-13 NOTE — Progress Notes (Signed)
Established Patient Office Visit  Subjective:  Patient ID: Brianna Jackson, female    DOB: [birth year]  Age: 65 y.o. MRN: [id number]  CC:  Chief Complaint  Patient presents with   Annual Exam    HPI [name] presents for Welcome to Medicare physical exam and medical follow-up.  She has history of hypertension, ventricular septal defect, hypothyroidism.  She has had previous positive PPD which was treated.  She had episode of nonspecific chest pain last October and was referred to cardiology for further evaluation.  She had echo last year which showed small VSD with normal LV function.  She had CT coronary morphology study.  Coronary calcium score of 0.  Normal coronary artery anatomy.  Small membranous VSD was again seen.  Patient's had no chest pain recently.  She walks some for exercise.  She has hypothyroidism and is on replacement.  Compliant with therapy.  Blood pressures been well controlled on lisinopril 20 mg daily.  She did have COVID last year and feels recovered from that.  Family history-mother had lung cancer.  Father had alcohol history and died of cirrhosis complications.  She has 1 brother and 5 sisters who are alive.  She had one sister who died of alcohol related complications and sepsis.  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  She still does occasional consultation work.  Non-smoker.  No regular alcohol use.  She has 1 daughter.  1.  Risk factors based on Past Medical , Social, and Family history reviewed and as indicated above with no significant changes.    2.  Limitations in physical activities None.  No recent falls. No balance issues.  3.  Depression/mood No active depression or anxiety issues PHQ 2 equals 0  4.  Hearing No deficits  5.  ADLs independent in all.  6.  Cognitive function (orientation to time and place, language, writing, speech,memory) no short or long term memory issues.  Language  and judgement intact.  7.  Home Safety no issues  8.  Height, weight, and visual acuity.all stable. She gets yearly eye exams Wt Readings from Last 3 Encounters:  04/13/20 142 lb 5 oz (64.6 kg)  11/23/19 155 lb (70.3 kg)  11/17/19 158 lb 6.4 oz (71.8 kg)    9.  Counseling discussed -Counseled regarding age and gender appropriate preventative screenings and immunizations.  10. Recommendation of preventive services.  Recommend Prevnar 13 and will recommend Pneumovax in a year.  She is in process of setting up repeat colonoscopy.  Needs DEXA scan.  Order placed.  11. Labs based on risk factors-TSH, basic metabolic panel, lipid panel, hepatic panel  12. Care Plan-as below  13. Other Providers-none  14. Written schedule of screening/prevention services given to patient. Health Maintenance  Topic Date Due   DEXA SCAN  Never done   TETANUS/TDAP  11/09/2019   COVID-19 Vaccine (2 - Pfizer 3-dose series) 12/11/2019   COLONOSCOPY (Pts 45-73yrs Insurance coverage will need to be confirmed)  12/13/2019   HIV Screening  04/13/2021 (Originally 05/09/1970)   PNA vac Low Risk Adult (2 of 2 - PPSV23) 04/13/2021   MAMMOGRAM  02/07/2022   PAP SMEAR-Modifier  04/13/2022   INFLUENZA VACCINE  Completed   Hepatitis C Screening  Completed   HPV VACCINES  Aged Out   15.  She states that she has not finalized advanced directives but is in process.   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   Thyroid disease    hypothyroid   Urine incontinence    UTI (urinary tract infection)     Past Surgical History:  Procedure Laterality Date   COLONOSCOPY  2006   MOLE REMOVAL  2008   MRSA  2010, 2012   absess   STRABISMUS  SURGERY  1964   TONSILLECTOMY      Family History  Problem Relation Age of Onset   Cancer Mother        lung cancer-never found primary sight    Alcohol abuse Father    Cirrhosis Father    Colon cancer Neg Hx    Colon polyps Neg Hx    Rectal cancer Neg Hx    Stomach cancer Neg Hx    Esophageal cancer Neg Hx    Breast cancer Neg Hx     Social History   Socioeconomic History   Marital status: Divorced    Spouse name: Not on file   Number of children: Not on file   Years of education: Not on file   Highest education level: Not on file  Occupational History   Not on file  Tobacco Use   Smoking status: Never Smoker   Smokeless tobacco: Never Used  Vaping Use   Vaping Use: Never used  Substance and Sexual Activity   Alcohol use: Yes    Alcohol/week: 0.0 standard drinks    Comment: rare socially   Drug use: No   Sexual activity: Not on file  Other Topics Concern   Not on file  Social History Narrative   Not on file   Social Determinants of Health   Financial Resource Strain: Not on file  Food Insecurity: Not on file  Transportation Needs: Not on file  Physical Activity: Not on file  Stress: Not on file  Social Connections: Not on file  Intimate Partner Violence: Not on file    Outpatient Medications Prior to Visit  Medication Sig Dispense Refill   albuterol (PROAIR HFA) 108 (90 Base) MCG/ACT inhaler Inhale 1-2 puffs into the lungs every 4 (four) hours as needed for wheezing or shortness of breath. 1 Inhaler 2   Calcium Carbonate-Vitamin D (CALCIUM-VITAMIN D) 500-200 MG-UNIT per tablet Take 1 tablet by mouth 2 (two) times daily with a meal.     vitamin B-12 (CYANOCOBALAMIN) 100 MCG tablet Take 50 mcg by mouth daily.     levothyroxine (SYNTHROID) 100 MCG tablet Take 1 tablet (100 mcg total) by mouth daily. 90 tablet 3   lisinopril (ZESTRIL) 20 MG tablet TAKE 1 TABLET BY MOUTH ONCE A DAY 90 tablet 3   metoprolol tartrate (LOPRESSOR) 100  MG tablet Take 1 tablet by mouth 2 hours prior to Cardiac CT 1 tablet 0   pantoprazole (PROTONIX) 20 MG tablet Take 1 tablet (20 mg total) by mouth daily. 30 tablet 0   sucralfate (CARAFATE) 1 g tablet Take 1 tablet (1 g total) by mouth 4 (four) times daily -  with meals and at bedtime for 14 days. 56 tablet 0   No facility-administered medications prior to visit.    Allergies  Allergen Reactions   Other Hives  Nuts    Wheat     Gluten intollerance    ROS Review of Systems  Constitutional: Negative for activity change, appetite change, fatigue, fever and unexpected weight change.  HENT: Negative for ear pain, hearing loss, sore throat and trouble swallowing.   Eyes: Negative for visual disturbance.  Respiratory: Negative for cough and shortness of breath.   Cardiovascular: Negative for chest pain and palpitations.  Gastrointestinal: Negative for abdominal pain, blood in stool, constipation and diarrhea.  Genitourinary: Negative for dysuria and hematuria.  Musculoskeletal: Negative for arthralgias, back pain and myalgias.  Skin: Negative for rash.  Neurological: Negative for dizziness, syncope and headaches.  Hematological: Negative for adenopathy.  Psychiatric/Behavioral: Negative for confusion and dysphoric mood.      Objective:    Physical Exam Constitutional:      Appearance: She is well-developed.  HENT:     Head: Normocephalic and atraumatic.  Eyes:     Pupils: Pupils are equal, round, and reactive to light.  Neck:     Thyroid: No thyromegaly.  Cardiovascular:     Rate and Rhythm: Normal rate and regular rhythm.     Heart sounds: Normal heart sounds. No murmur heard.   Pulmonary:     Effort: No respiratory distress.     Breath sounds: Normal breath sounds. No wheezing or rales.  Abdominal:     General: Bowel sounds are normal. There is no distension.     Palpations: Abdomen is soft. There is no mass.     Tenderness: There is no abdominal tenderness.  There is no guarding or rebound.  Musculoskeletal:        General: Normal range of motion.     Cervical back: Normal range of motion and neck supple.  Lymphadenopathy:     Cervical: No cervical adenopathy.  Skin:    Findings: No rash.  Neurological:     Mental Status: She is alert and oriented to person, place, and time.     Cranial Nerves: No cranial nerve deficit.  Psychiatric:        Behavior: Behavior normal.        Thought Content: Thought content normal.        Judgment: Judgment normal.     BP 128/80    Pulse 73    Temp 98.3 F (36.8 C) (Oral)    Ht 5' (1.524 m)    Wt 142 lb 5 oz (64.6 kg)    SpO2 98%    BMI 27.79 kg/m  Wt Readings from Last 3 Encounters:  04/13/20 142 lb 5 oz (64.6 kg)  11/23/19 155 lb (70.3 kg)  11/17/19 158 lb 6.4 oz (71.8 kg)     Health Maintenance Due  Topic Date Due   DEXA SCAN  Never done   TETANUS/TDAP  11/09/2019   COVID-19 Vaccine (2 - Pfizer 3-dose series) 12/11/2019   COLONOSCOPY (Pts 45-90yrs Insurance coverage will need to be confirmed)  12/13/2019    There are no preventive care reminders to display for this patient.  Lab Results  Component Value Date   TSH 4.70 (H) 04/13/2019   Lab Results  Component Value Date   WBC 7.1 11/14/2019   HGB 14.8 11/14/2019   HCT 42.4 11/14/2019   MCV 83.3 11/14/2019   PLT 258 11/14/2019   Lab Results  Component Value Date   NA 142 11/26/2019   K 3.7 11/26/2019   CO2 22 11/26/2019   GLUCOSE 87 11/26/2019   BUN 11 11/26/2019   CREATININE  0.97 11/26/2019   BILITOT 0.9 11/14/2019   ALKPHOS 68 11/14/2019   AST 25 11/14/2019   ALT 20 11/14/2019   PROT 7.1 11/14/2019   ALBUMIN 4.1 11/14/2019   CALCIUM 9.3 11/26/2019   ANIONGAP 10 11/14/2019   GFR 62.06 04/13/2019   Lab Results  Component Value Date   CHOL 179 04/13/2019   Lab Results  Component Value Date   HDL 42.00 04/13/2019   Lab Results  Component Value Date   LDLCALC 113 (H) 04/13/2019   Lab Results  Component  Value Date   TRIG 119.0 04/13/2019   Lab Results  Component Value Date   CHOLHDL 4 04/13/2019   No results found for: HGBA1C    Assessment & Plan:   #1 [facility] to [facility] Wellness visit.    -No indication for EKG as she had one recently in the ER back in October. -Needs follow-up colonoscopy and she is in process of setting that up -Prevnar 13 given today.  Recommend Pneumovax in 1 year. -Set up DEXA scan and she will schedule -Continue regular weightbearing exercise and calcium and vitamin D - continue annual flu vaccine -Shingrix vaccine already given -Does need tetanus booster this year.  #2 hypertension stable and at goal -Refill lisinopril for 1 year -Check basic metabolic panel  #3 hypothyroidism -Check TSH -Refill levothyroxine for 1 year  #4 history of mild dyslipidemia.  Thankfully, she has coronary calcium score of 0 and CT morphology study showing no significant CAD. -Repeat fasting lipids -Continue low saturated fat diet   Meds ordered this encounter  Medications   lisinopril (ZESTRIL) 20 MG tablet    Sig: TAKE 1 TABLET BY MOUTH ONCE A DAY    Dispense:  90 tablet    Refill:  3   levothyroxine (SYNTHROID) 100 MCG tablet    Sig: Take 1 tablet (100 mcg total) by mouth daily.    Dispense:  90 tablet    Refill:  3    Follow-up: No follow-ups on file.    Carolann Littler, MD

## 2020-04-15 ENCOUNTER — Ambulatory Visit (INDEPENDENT_AMBULATORY_CARE_PROVIDER_SITE_OTHER)
Admission: RE | Admit: 2020-04-15 | Discharge: 2020-04-15 | Disposition: A | Discharge status: Home / Self Care | Payer: Medicare Other | Source: Ambulatory Visit | Attending: Family Medicine | Admitting: Family Medicine

## 2020-04-15 ENCOUNTER — Other Ambulatory Visit: Payer: Self-pay

## 2020-04-15 DIAGNOSIS — Z780 Asymptomatic menopausal state: Secondary | ICD-10-CM | POA: Diagnosis not present

## 2020-04-19 ENCOUNTER — Other Ambulatory Visit (HOSPITAL_BASED_OUTPATIENT_CLINIC_OR_DEPARTMENT_OTHER): Payer: Self-pay

## 2020-04-25 MED FILL — LISINOPRIL 20 MG TABS: 20 | 90 days supply | Qty: 90 | Fill #0

## 2020-05-04 ENCOUNTER — Encounter: Payer: Self-pay | Admitting: Gastroenterology

## 2020-07-07 ENCOUNTER — Ambulatory Visit (AMBULATORY_SURGERY_CENTER): Payer: BLUE CROSS/BLUE SHIELD

## 2020-07-07 ENCOUNTER — Other Ambulatory Visit: Payer: Self-pay

## 2020-07-07 ENCOUNTER — Other Ambulatory Visit (HOSPITAL_COMMUNITY): Payer: Self-pay

## 2020-07-07 VITALS — Ht 60.0 in | Wt 141.0 lb

## 2020-07-07 DIAGNOSIS — Z86010 Personal history of colonic polyps: Secondary | ICD-10-CM

## 2020-07-07 MED ORDER — PEG 3350-KCL-NA BICARB-NACL 420 G PO SOLR
4000.0000 mL | Freq: Once | ORAL | 0 refills | Status: AC
  Filled 2020-07-07: qty 4000, 1d supply, fill #0

## 2020-07-07 NOTE — Progress Notes (Signed)

## 2020-07-08 ENCOUNTER — Other Ambulatory Visit (HOSPITAL_COMMUNITY): Payer: Self-pay

## 2020-07-11 ENCOUNTER — Other Ambulatory Visit (HOSPITAL_COMMUNITY): Payer: Self-pay

## 2020-07-20 ENCOUNTER — Other Ambulatory Visit: Payer: Self-pay

## 2020-07-20 ENCOUNTER — Encounter: Payer: Self-pay | Admitting: Gastroenterology

## 2020-07-20 ENCOUNTER — Ambulatory Visit (AMBULATORY_SURGERY_CENTER): Payer: Medicare Other | Admitting: Gastroenterology

## 2020-07-20 VITALS — BP 140/87 | HR 65 | Temp 98.2°F | Resp 16 | Ht 60.0 in | Wt 141.0 lb

## 2020-07-20 DIAGNOSIS — Z86010 Personal history of colonic polyps: Secondary | ICD-10-CM

## 2020-07-20 MED ORDER — SODIUM CHLORIDE 0.9 % IV SOLN: 500.0000 mL | INTRAVENOUS | Status: DC

## 2020-07-20 NOTE — Progress Notes (Signed)
pt tolerated well. VSS. awake and to recovery. Report given to RN.  

## 2020-07-20 NOTE — Op Note (Signed)
[facility] Patient Name: Brianna Jackson Procedure Date: 07/20/2020 7:25 AM MRN: [id number] Endoscopist: Remo Lipps P. Havery Moros , MD Age: 65 Referring MD:  Date of Birth: 30-Mar-1955 Gender: Female Account #: [id number] Procedure:                Colonoscopy Indications:              High risk colon cancer surveillance: Personal                            history of colonic polyps (2 small sessile serrated                            polyps 11/2014) Medicines:                Monitored Anesthesia Care Procedure:                Pre-Anesthesia Assessment:                           - Prior to the procedure, a History and Physical                            was performed, and patient medications and                            allergies were reviewed. The patient's tolerance of                            previous anesthesia was also reviewed. The risks                            and benefits of the procedure and the sedation                            options and risks were discussed with the patient.                            All questions were answered, and informed consent                            was obtained. Prior Anticoagulants: The patient has                            taken no previous anticoagulant or antiplatelet                            agents. ASA Grade Assessment: II - A patient with                            mild systemic disease. After reviewing the risks                            and benefits, the patient was deemed in  satisfactory condition to undergo the procedure.                           After obtaining informed consent, the colonoscope                            was passed under direct vision. Throughout the                            procedure, the patient's blood pressure, pulse, and                            oxygen saturations were monitored continuously. The                            colonoscopy was performed without  difficulty. The                            patient tolerated the procedure well. The quality                            of the bowel preparation was good. The terminal                            ileum, ileocecal valve, appendiceal orifice, and                            rectum were photographed. The Olympus [id number]-[id number]                            (#[id number]) Colonoscope was introduced through the                            anus and advanced to the the terminal ileum, with                            identification of the appendiceal orifice and IC                            valve. Scope In: 8:05:20 AM Scope Out: 8:18:00 AM Scope Withdrawal Time: 0 hours 10 minutes 57 seconds  Total Procedure Duration: 0 hours 12 minutes 40 seconds  Findings:                 The perianal and digital rectal examinations were                            normal.                           The terminal ileum appeared normal.                           Multiple medium-mouthed diverticula were found in  the entire colon.                           Internal hemorrhoids were found during                            retroflexion. The hemorrhoids were small.                           The exam was otherwise without abnormality. Complications:            No immediate complications. Estimated blood loss:                            None. Estimated Blood Loss:     Estimated blood loss: none. Impression:               - The examined portion of the ileum was normal.                           - Diverticulosis in the entire examined colon.                           - Internal hemorrhoids.                           - The examination was otherwise normal.                           - No polyps. Recommendation:           - Patient has a contact number available for                            emergencies. The signs and symptoms of potential                            delayed complications were discussed with  the                            patient. Return to normal activities tomorrow.                            Written discharge instructions were provided to the                            patient.                           - Resume previous diet.                           - Continue present medications.                           - Would normally repeat colonoscopy in 10 years for  screening purposes given this finding, however at                            that time the patient will be 65 years old. In this                            light no further screening colonoscopy is                            recommended (stop screening at age 22 in most                            patients). Remo Lipps P. Havery Moros, MD 07/20/2020 8:23:24 AM This report has been signed electronically.

## 2020-07-20 NOTE — Progress Notes (Signed)
VS by CW.  Previsit with JK.  No changes since previsit

## 2020-07-20 NOTE — Patient Instructions (Signed)
Read all of the handouts given to you by your recovery room nurse.  YOU HAD AN ENDOSCOPIC PROCEDURE TODAY AT [facility]:   Refer to the procedure report that was given to you for any specific questions about what was found during the examination.  If the procedure report does not answer your questions, please call your gastroenterologist to clarify.  If you requested that your care partner not be given the details of your procedure findings, then the procedure report has been included in a sealed envelope for you to review at your convenience later.  YOU SHOULD EXPECT: Some feelings of bloating in the abdomen. Passage of more gas than usual.  Walking can help get rid of the air that was put into your GI tract during the procedure and reduce the bloating. If you had a lower endoscopy (such as a colonoscopy or flexible sigmoidoscopy) you may notice spotting of blood in your stool or on the toilet paper. If you underwent a bowel prep for your procedure, you may not have a normal bowel movement for a few days.  Please Note:  You might notice some irritation and congestion in your nose or some drainage.  This is from the oxygen used during your procedure.  There is no need for concern and it should clear up in a day or so.  SYMPTOMS TO REPORT IMMEDIATELY:  Following lower endoscopy (colonoscopy or flexible sigmoidoscopy):  Excessive amounts of blood in the stool  Significant tenderness or worsening of abdominal pains  Swelling of the abdomen that is new, acute  Fever of 100F or higher   For urgent or emergent issues, a gastroenterologist can be reached at any hour by calling [phone]. Do not use MyChart messaging for urgent concerns.    DIET:  We do recommend a small meal at first, but then you may proceed to your regular diet.  Drink plenty of fluids but you should avoid alcoholic beverages for 24 hours.  Try to increase the fiber in your diet, and drink plenty of  water.  ACTIVITY:  You should plan to take it easy for the rest of today and you should NOT DRIVE or use heavy machinery until tomorrow (because of the sedation medicines used during the test).    FOLLOW UP: Our staff will call the number listed on your records 48-72 hours following your procedure to check on you and address any questions or concerns that you may have regarding the information given to you following your procedure. If we do not reach you, we will leave a message.  We will attempt to reach you two times.  During this call, we will ask if you have developed any symptoms of COVID 19. If you develop any symptoms (ie: fever, flu-like symptoms, shortness of breath, cough etc.) before then, please call [phone].  If you test positive for Covid 19 in the 2 weeks post procedure, please call and report this information to [place].     SIGNATURES/CONFIDENTIALITY: You and/or your care partner have signed paperwork which will be entered into your electronic medical record.  These signatures attest to the fact that that the information above on your After Visit Summary has been reviewed and is understood.  Full responsibility of the confidentiality of this discharge information lies with you and/or your care-partner.

## 2020-07-22 ENCOUNTER — Telehealth: Payer: Self-pay

## 2020-07-22 NOTE — Telephone Encounter (Signed)
  Follow up Call-  Call back number 07/20/2020  Post procedure Call Back phone  # [phone]  Permission to leave phone message Yes  Some recent data might be hidden     Patient questions:  Do you have a fever, pain , or abdominal swelling? No. Pain Score  0 *  Have you tolerated food without any problems? Yes.    Have you been able to return to your normal activities? Yes.    Do you have any questions about your discharge instructions: Diet   No. Medications  No. Follow up visit  No.  Do you have questions or concerns about your Care? No.  Actions: * If pain score is 4 or above: No action needed, pain <4.

## 2020-07-30 ENCOUNTER — Other Ambulatory Visit (HOSPITAL_COMMUNITY): Payer: Self-pay

## 2020-07-30 MED FILL — Levothyroxine Sodium Tab 100 MCG: ORAL | 90 days supply | Qty: 90 | Fill #0 | Status: AC

## 2020-07-30 MED FILL — Lisinopril Tab 20 MG: ORAL | 90 days supply | Qty: 90 | Fill #0 | Status: AC

## 2020-08-02 ENCOUNTER — Other Ambulatory Visit (HOSPITAL_COMMUNITY): Payer: Self-pay

## 2020-08-03 ENCOUNTER — Other Ambulatory Visit (HOSPITAL_COMMUNITY): Payer: Self-pay

## 2020-10-21 ENCOUNTER — Other Ambulatory Visit (HOSPITAL_COMMUNITY): Payer: Self-pay

## 2020-10-21 MED FILL — Levothyroxine Sodium Tab 100 MCG: ORAL | 90 days supply | Qty: 90 | Fill #1 | Status: AC

## 2020-10-21 MED FILL — Lisinopril Tab 20 MG: ORAL | 90 days supply | Qty: 90 | Fill #1 | Status: AC

## 2021-01-16 ENCOUNTER — Other Ambulatory Visit: Payer: Self-pay | Admitting: Family Medicine

## 2021-01-16 DIAGNOSIS — Z1231 Encounter for screening mammogram for malignant neoplasm of breast: Secondary | ICD-10-CM

## 2021-01-26 ENCOUNTER — Other Ambulatory Visit (HOSPITAL_COMMUNITY): Payer: Self-pay

## 2021-01-26 MED FILL — Lisinopril Tab 20 MG: ORAL | 90 days supply | Qty: 90 | Fill #2 | Status: AC

## 2021-01-26 MED FILL — Levothyroxine Sodium Tab 100 MCG: ORAL | 90 days supply | Qty: 90 | Fill #2 | Status: AC

## 2021-02-16 ENCOUNTER — Ambulatory Visit: Payer: BLUE CROSS/BLUE SHIELD

## 2021-03-08 ENCOUNTER — Ambulatory Visit
Admission: RE | Admit: 2021-03-08 | Discharge: 2021-03-08 | Disposition: A | Discharge status: Home / Self Care | Payer: Medicare Other | Source: Ambulatory Visit | Attending: Family Medicine | Admitting: Family Medicine

## 2021-03-08 DIAGNOSIS — Z1231 Encounter for screening mammogram for malignant neoplasm of breast: Secondary | ICD-10-CM

## 2021-04-26 ENCOUNTER — Other Ambulatory Visit (HOSPITAL_COMMUNITY): Payer: Self-pay

## 2021-04-26 ENCOUNTER — Encounter: Payer: Self-pay | Admitting: Family Medicine

## 2021-04-26 ENCOUNTER — Ambulatory Visit (INDEPENDENT_AMBULATORY_CARE_PROVIDER_SITE_OTHER): Payer: Medicare Other | Admitting: Family Medicine

## 2021-04-26 VITALS — BP 138/88 | HR 62 | Temp 98.0°F | Ht 60.0 in | Wt 136.8 lb

## 2021-04-26 DIAGNOSIS — I10 Essential (primary) hypertension: Secondary | ICD-10-CM | POA: Diagnosis not present

## 2021-04-26 DIAGNOSIS — E039 Hypothyroidism, unspecified: Secondary | ICD-10-CM

## 2021-04-26 DIAGNOSIS — Z0000 Encounter for general adult medical examination without abnormal findings: Secondary | ICD-10-CM | POA: Diagnosis not present

## 2021-04-26 DIAGNOSIS — Z23 Encounter for immunization: Secondary | ICD-10-CM

## 2021-04-26 LAB — TSH: TSH: 2.26 u[IU]/mL (ref 0.35–5.50)

## 2021-04-26 LAB — HEPATIC FUNCTION PANEL
ALT: 14 U/L (ref 0–35)
AST: 20 U/L (ref 0–37)
Albumin: 4.4 g/dL (ref 3.5–5.2)
Alkaline Phosphatase: 75 U/L (ref 39–117)
Bilirubin, Direct: 0.1 mg/dL (ref 0.0–0.3)
Total Bilirubin: 0.6 mg/dL (ref 0.2–1.2)
Total Protein: 7.1 g/dL (ref 6.0–8.3)

## 2021-04-26 LAB — CBC WITH DIFFERENTIAL/PLATELET
Basophils Absolute: 0.1 10*3/uL (ref 0.0–0.1)
Basophils Relative: 1 % (ref 0.0–3.0)
Eosinophils Absolute: 0.1 10*3/uL (ref 0.0–0.7)
Eosinophils Relative: 1.7 % (ref 0.0–5.0)
HCT: 41.6 % (ref 36.0–46.0)
Hemoglobin: 14 g/dL (ref 12.0–15.0)
Lymphocytes Relative: 19.4 % (ref 12.0–46.0)
Lymphs Abs: 1.4 10*3/uL (ref 0.7–4.0)
MCHC: 33.6 g/dL (ref 30.0–36.0)
MCV: 86 fl (ref 78.0–100.0)
Monocytes Absolute: 0.6 10*3/uL (ref 0.1–1.0)
Monocytes Relative: 8.1 % (ref 3.0–12.0)
Neutro Abs: 4.9 10*3/uL (ref 1.4–7.7)
Neutrophils Relative %: 69.8 % (ref 43.0–77.0)
Platelets: 282 10*3/uL (ref 150.0–400.0)
RBC: 4.83 Mil/uL (ref 3.87–5.11)
RDW: 14 % (ref 11.5–15.5)
WBC: 7 10*3/uL (ref 4.0–10.5)

## 2021-04-26 LAB — BASIC METABOLIC PANEL
BUN: 13 mg/dL (ref 6–23)
CO2: 28 mEq/L (ref 19–32)
Calcium: 9.9 mg/dL (ref 8.4–10.5)
Chloride: 106 mEq/L (ref 96–112)
Creatinine, Ser: 0.94 mg/dL (ref 0.40–1.20)
GFR: 63.03 mL/min (ref >60.00)
Glucose, Bld: 85 mg/dL (ref 70–99)
Potassium: 4.3 mEq/L (ref 3.5–5.1)
Sodium: 143 mEq/L (ref 135–145)

## 2021-04-26 LAB — LIPID PANEL
Cholesterol: 181 mg/dL (ref 0–200)
HDL: 47.6 mg/dL (ref >39.00)
LDL Cholesterol: 107 mg/dL — ABNORMAL HIGH (ref 0–99)
NonHDL: 133.6
Total CHOL/HDL Ratio: 4
Triglycerides: 135 mg/dL (ref 0.0–149.0)
VLDL: 27 mg/dL (ref 0.0–40.0)

## 2021-04-26 MED ORDER — LISINOPRIL 20 MG PO TABS
ORAL_TABLET | Freq: Every day | ORAL | 3 refills | Status: DC
  Filled 2021-04-26: qty 90, 90d supply, fill #0
  Filled 2021-07-17: qty 90, 90d supply, fill #1
  Filled 2021-10-23: qty 90, 90d supply, fill #2
  Filled 2022-01-26: qty 90, 90d supply, fill #3

## 2021-04-26 MED ORDER — LEVOTHYROXINE SODIUM 100 MCG PO TABS
ORAL_TABLET | Freq: Every day | ORAL | 3 refills | Status: DC
  Filled 2021-04-26: qty 90, 90d supply, fill #0
  Filled 2021-07-17: qty 90, 90d supply, fill #1
  Filled 2021-10-23: qty 90, 90d supply, fill #2
  Filled 2022-01-26: qty 90, 90d supply, fill #3

## 2021-04-26 NOTE — Patient Instructions (Signed)
Monitor blood pressure and be in touch if consistently > 130/80 

## 2021-04-26 NOTE — Progress Notes (Signed)
? ?Established Patient Office Visit ? ?Subjective:  ?Patient ID: Brianna Jackson, female    DOB: 15-Apr-1955  Age: 66 y.o. MRN: [id number] ? ?CC:  ?Chief Complaint  ?Patient presents with  ? Annual Exam  ? ? ?HPI ?Brianna Jackson presents for physical exam.  She is in full retirement and enjoying spending time with some grandchildren.  She has been very busy helping to care for them at times.  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  Generally doing well.  She has history of hypertension which has been controlled on lisinopril in the past.  She is on levothyroxine for hypothyroidism.  She has history of VSD.  Denies any recent dyspnea or chest pain.  No syncope.  She has managed to lose some weight through walking and some dietary change. ? ?Health maintenance reviewed ? ?-She is up-to-date with everything except for Pneumovax.  Also does need tetanus booster but declines today. ?-Colonoscopy due 2027 ?-She plans to get mammogram next year.  Most recent mammogram 03/08/2021 ?-Shingles vaccine completed. ?-Bone density last year ?-Gets annual flu vaccine ? ?Family history-reviewed.  Only significant changes younger brother diagnosed with oropharyngeal cancer and lymphoma during the past year. ? ?Social history-retired from nursing administration.  Non-smoker.  No regular alcohol.  1 daughter.  2 grandchildren. ? ?Past Medical History:  ?Diagnosis Date  ? Allergy   ? seasonal  ? Chicken pox   ? GERD (gastroesophageal reflux disease)   ? not diagnosed  ? Heart murmur   ? VSD, ECHO 2010  ? History of MRSA infection 2010, 2012  ? Hyperlipidemia   ? Hypertension   ? Positive TB test   ? treated with INH childhood  ? Thyroid disease   ? hypothyroid  ? Urine incontinence   ? UTI (urinary tract infection)   ? ? ?Past Surgical History:  ?Procedure Laterality Date  ? COLONOSCOPY  01/30/2004  ? COLONOSCOPY  11/2014  ? SA SSP  ? MOLE REMOVAL  01/29/2006  ? MRSA  2010, 2012  ? absess  ?  POLYPECTOMY    ? STRABISMUS SURGERY  01/29/1962  ? TONSILLECTOMY    ? ? ?Family History  ?Problem Relation Age of Onset  ? Cancer Mother   ?     lung cancer-never found primary sight   ? Alcohol abuse Father   ? Cirrhosis Father   ? Colon cancer Neg Hx   ? Colon polyps Neg Hx   ? Rectal cancer Neg Hx   ? Stomach cancer Neg Hx   ? Esophageal cancer Neg Hx   ? Breast cancer Neg Hx   ? ? ?Social History  ? ?Socioeconomic History  ? Marital status: Divorced  ?  Spouse name: Not on file  ? Number of children: Not on file  ? Years of education: Not on file  ? Highest education level: Not on file  ?Occupational History  ? Not on file  ?Tobacco Use  ? Smoking status: Never  ? Smokeless tobacco: Never  ?Vaping Use  ? Vaping Use: Never used  ?Substance and Sexual Activity  ? Alcohol use: Yes  ?  Alcohol/week: 0.0 standard drinks  ?  Comment: rare socially  ? Drug use: No  ? Sexual activity: Yes  ?Other Topics Concern  ? Not on file  ?Social History Narrative  ? Not on file  ? ?Social Determinants of Health  ? ?Financial Resource Strain: Not on file  ?  Food Insecurity: Not on file  ?Transportation Needs: Not on file  ?Physical Activity: Not on file  ?Stress: Not on file  ?Social Connections: Not on file  ?Intimate Partner Violence: Not on file  ? ? ?Outpatient Medications Prior to Visit  ?Medication Sig Dispense Refill  ? albuterol (PROAIR HFA) 108 (90 Base) MCG/ACT inhaler Inhale 1-2 puffs into the lungs every 4 (four) hours as needed for wheezing or shortness of breath. 1 Inhaler 2  ? Calcium Carbonate-Vitamin D (CALCIUM-VITAMIN D) 500-200 MG-UNIT per tablet Take 1 tablet by mouth 2 (two) times daily with a meal.    ? vitamin B-12 (CYANOCOBALAMIN) 100 MCG tablet Take 100 mcg by mouth daily.    ? levothyroxine (SYNTHROID) 100 MCG tablet TAKE 1 TABLET BY MOUTH ONCE DAILY 90 tablet 3  ? lisinopril (ZESTRIL) 20 MG tablet TAKE 1 TABLET BY MOUTH ONCE A DAY 90 tablet 3  ? ?No facility-administered medications prior to visit.   ? ? ?Allergies  ?Allergen Reactions  ? Other Hives  ?  Nuts ?  ? Wheat   ?  Gluten intollerance  ? ? ?ROS ?Review of Systems  ?Constitutional:  Negative for activity change, appetite change, fatigue, fever and unexpected weight change.  ?HENT:  Negative for ear pain, hearing loss, sore throat and trouble swallowing.   ?Eyes:  Negative for visual disturbance.  ?Respiratory:  Negative for cough and shortness of breath.   ?Cardiovascular:  Negative for chest pain and palpitations.  ?Gastrointestinal:  Negative for abdominal pain, blood in stool, constipation and diarrhea.  ?Genitourinary:  Negative for dysuria and hematuria.  ?Musculoskeletal:  Negative for arthralgias and myalgias.  ?Skin:  Negative for rash.  ?Neurological:  Negative for dizziness, syncope and headaches.  ?Hematological:  Negative for adenopathy.  ?Psychiatric/Behavioral:  Negative for confusion and dysphoric mood.   ? ?  ?Objective:  ?  ?Physical Exam ?Constitutional:   ?   Appearance: She is well-developed.  ?HENT:  ?   Head: Normocephalic and atraumatic.  ?Eyes:  ?   Pupils: Pupils are equal, round, and reactive to light.  ?Neck:  ?   Thyroid: No thyromegaly.  ?Cardiovascular:  ?   Rate and Rhythm: Normal rate and regular rhythm.  ?   Heart sounds: Murmur heard.  ?   Comments: Prominent holosystolic murmur right upper sternal border and left sternal border which is unchanged ?Pulmonary:  ?   Effort: No respiratory distress.  ?   Breath sounds: Normal breath sounds. No wheezing or rales.  ?Abdominal:  ?   General: Bowel sounds are normal. There is no distension.  ?   Palpations: Abdomen is soft. There is no mass.  ?   Tenderness: There is no abdominal tenderness. There is no guarding or rebound.  ?Musculoskeletal:     ?   General: Normal range of motion.  ?   Cervical back: Normal range of motion and neck supple.  ?   Right lower leg: No edema.  ?   Left lower leg: No edema.  ?Lymphadenopathy:  ?   Cervical: No cervical adenopathy.  ?Skin: ?    Findings: No rash.  ?Neurological:  ?   Mental Status: She is alert and oriented to person, place, and time.  ?   Cranial Nerves: No cranial nerve deficit.  ?   Deep Tendon Reflexes: Reflexes normal.  ?Psychiatric:     ?   Behavior: Behavior normal.     ?   Thought Content: Thought content normal.     ?  Judgment: Judgment normal.  ? ? ?BP 138/88 (BP Location: Left Arm, Patient Position: Sitting, Cuff Size: Normal)   Pulse 62   Temp 98 ?F (36.7 ?C) (Oral)   Ht 5' (1.524 m)   Wt 136 lb 12.8 oz (62.1 kg)   SpO2 97%   BMI 26.72 kg/m?  ?Wt Readings from Last 3 Encounters:  ?04/26/21 136 lb 12.8 oz (62.1 kg)  ?07/20/20 141 lb (64 kg)  ?07/07/20 141 lb (64 kg)  ? ? ? ?Health Maintenance Due  ?Topic Date Due  ? TETANUS/TDAP  11/09/2019  ? COVID-19 Vaccine (2 - Pfizer series) 12/11/2019  ? Pneumonia Vaccine 38+ Years old (2 - PPSV23 if available, else PCV20) 04/13/2021  ? ? ?There are no preventive care reminders to display for this patient. ? ?Lab Results  ?Component Value Date  ? TSH 1.12 04/13/2020  ? ?Lab Results  ?Component Value Date  ? WBC 7.1 11/14/2019  ? HGB 14.8 11/14/2019  ? HCT 42.4 11/14/2019  ? MCV 83.3 11/14/2019  ? PLT 258 11/14/2019  ? ?Lab Results  ?Component Value Date  ? NA 142 04/13/2020  ? K 3.7 04/13/2020  ? CO2 29 04/13/2020  ? GLUCOSE 91 04/13/2020  ? BUN 11 04/13/2020  ? CREATININE 0.93 04/13/2020  ? BILITOT 0.7 04/13/2020  ? ALKPHOS 76 04/13/2020  ? AST 20 04/13/2020  ? ALT 13 04/13/2020  ? PROT 6.6 04/13/2020  ? ALBUMIN 4.1 04/13/2020  ? CALCIUM 9.7 04/13/2020  ? ANIONGAP 10 11/14/2019  ? GFR 64.31 04/13/2020  ? ?Lab Results  ?Component Value Date  ? CHOL 191 04/13/2020  ? ?Lab Results  ?Component Value Date  ? HDL 53.40 04/13/2020  ? ?Lab Results  ?Component Value Date  ? LDLCALC 118 (H) 04/13/2020  ? ?Lab Results  ?Component Value Date  ? TRIG 96.0 04/13/2020  ? ?Lab Results  ?Component Value Date  ? CHOLHDL 4 04/13/2020  ? ?No results found for: HGBA1C ? ?  ?Assessment & Plan:   ? ?Problem List Items Addressed This Visit   ?None ?Visit Diagnoses   ? ? Physical exam    -  Primary  ? Relevant Orders  ? Basic metabolic panel  ? Lipid panel  ? CBC with Differential/Platelet  ? TSH  ? Hepatic function pa

## 2021-04-28 ENCOUNTER — Telehealth: Payer: Self-pay | Admitting: Family Medicine

## 2021-04-28 NOTE — Telephone Encounter (Signed)
Patient called in to inform Dr.Burchette of last BP 140/90. Patient thinks that drinking caffeine could be the problem. Patient wants to know what Dr.Burchette wants her to do. ? ?Please advise. ?

## 2021-04-28 NOTE — Telephone Encounter (Signed)
Pt informed of the message and verbalized understanding  

## 2021-05-05 ENCOUNTER — Other Ambulatory Visit: Payer: Self-pay

## 2021-05-05 ENCOUNTER — Emergency Department (HOSPITAL_BASED_OUTPATIENT_CLINIC_OR_DEPARTMENT_OTHER)
Admission: EM | Admit: 2021-05-05 | Discharge: 2021-05-05 | Disposition: A | Discharge status: Home / Self Care | Payer: Medicare Other | Attending: Emergency Medicine | Admitting: Emergency Medicine

## 2021-05-05 ENCOUNTER — Encounter (HOSPITAL_BASED_OUTPATIENT_CLINIC_OR_DEPARTMENT_OTHER): Payer: Self-pay

## 2021-05-05 DIAGNOSIS — Z79899 Other long term (current) drug therapy: Secondary | ICD-10-CM | POA: Diagnosis not present

## 2021-05-05 DIAGNOSIS — I10 Essential (primary) hypertension: Secondary | ICD-10-CM

## 2021-05-05 NOTE — ED Provider Notes (Signed)
?[facility] ?Provider Note ? ? ?CSN: [id number] ?Arrival date & time: 05/05/21  1548 ? ?  ? ?History ? ?Chief Complaint  ?Patient presents with  ? Hypertension  ? ? ?LAVERNA DOSSETT is a 66 y.o. female.  Presents to ER with concern for high blood pressure.  Patient reports that on 3/29 in the office her blood pressure was noted to be mildly high.  Her PCP advised her to monitor her blood pressure at home.  She previously says that she was managed on lisinopril 20 mg for a long time and her blood pressure has historically been quite well controlled.  She recently has been working on her diet and exercise.  States that after knowing her blood pressure was elevated she tried cutting out coffee and caffeine completely from her diet.  States that over the past couple days her blood pressure seems to be getting worse, up to 086V, 784O systolic at home.  She did note slight pain in the back of her head today.  This improved with Motrin.  She currently does not have any sort of headache.  She says at no point did she have any sort of chest pain or difficulty in breathing.  No lightheadedness or palpitations.  She contacted the her doctor's office, spoke to nurse on-call and they advised her to seek evaluation today; the doctor's office was closed due to holiday and patient decided to come to ER to get checked out. ? ?Completed chart review, reviewed recent PCP visit on 3/29 -BP at the time was 138/88.  Basic labs including BMP, lipid panel, CBC, TSH, hepatic function panel were obtained on 3/29.  I reviewed these labs, she had normal kidney function, no anemia, normal TSH. ? ?HPI ? ?  ? ?Home Medications ?Prior to Admission medications   ?Medication Sig Start Date End Date Taking? Authorizing Provider  ?albuterol (PROAIR HFA) 108 (90 Base) MCG/ACT inhaler Inhale 1-2 puffs into the lungs every 4 (four) hours as needed for wheezing or shortness of breath. 02/19/17   Burchette, Alinda Sierras, MD  ?Calcium  Carbonate-Vitamin D (CALCIUM-VITAMIN D) 500-200 MG-UNIT per tablet Take 1 tablet by mouth 2 (two) times daily with a meal.    [provider]  ?levothyroxine (SYNTHROID) 100 MCG tablet TAKE 1 TABLET BY MOUTH ONCE DAILY 04/26/21 04/26/22  Burchette, Alinda Sierras, MD  ?lisinopril (ZESTRIL) 20 MG tablet TAKE 1 TABLET BY MOUTH ONCE A DAY 04/26/21 04/26/22  Burchette, Alinda Sierras, MD  ?vitamin B-12 (CYANOCOBALAMIN) 100 MCG tablet Take 100 mcg by mouth daily.    [provider]  ?metoprolol tartrate (LOPRESSOR) 100 MG tablet Take 1 tablet by mouth 2 hours prior to Cardiac CT 11/23/19 04/13/20  Jettie Booze, MD  ?   ? ?Allergies    ?Other and Wheat   ? ?Review of Systems   ?Review of Systems  ?Constitutional:  Negative for chills and fever.  ?HENT:  Negative for ear pain and sore throat.   ?Eyes:  Negative for pain and visual disturbance.  ?Respiratory:  Negative for cough and shortness of breath.   ?Cardiovascular:  Negative for chest pain and palpitations.  ?Gastrointestinal:  Negative for abdominal pain and vomiting.  ?Genitourinary:  Negative for dysuria and hematuria.  ?Musculoskeletal:  Negative for arthralgias and back pain.  ?Skin:  Negative for color change and rash.  ?Neurological:  Negative for seizures and syncope.  ?All other systems reviewed and are negative. ? ?Physical Exam ?Updated Vital Signs ?BP Marland Kitchen)  182/99 (BP Location: Left Arm)   Pulse 82   Temp 98.3 ?F (36.8 ?C) (Oral)   Resp 15   Ht 5' (1.524 m)   Wt 60.3 kg   SpO2 99%   BMI 25.97 kg/m?  ?Physical Exam ?Vitals and nursing note reviewed.  ?Constitutional:   ?   General: She is not in acute distress. ?   Appearance: She is well-developed.  ?HENT:  ?   Head: Normocephalic and atraumatic.  ?Eyes:  ?   Conjunctiva/sclera: Conjunctivae normal.  ?Cardiovascular:  ?   Rate and Rhythm: Normal rate and regular rhythm.  ?   Heart sounds: No murmur heard. ?Pulmonary:  ?   Effort: Pulmonary effort is normal. No respiratory distress.  ?   Breath  sounds: Normal breath sounds.  ?Abdominal:  ?   Palpations: Abdomen is soft.  ?   Tenderness: There is no abdominal tenderness.  ?Musculoskeletal:     ?   General: No swelling.  ?   Cervical back: Neck supple.  ?Skin: ?   General: Skin is warm and dry.  ?   Capillary Refill: Capillary refill takes less than 2 seconds.  ?Neurological:  ?   General: No focal deficit present.  ?   Mental Status: She is alert and oriented to person, place, and time.  ?Psychiatric:     ?   Mood and Affect: Mood normal.  ? ? ?ED Results / Procedures / Treatments   ?Labs ?(all labs ordered are listed, but only abnormal results are displayed) ?Labs Reviewed - No data to display ? ?EKG ?None ? ?Radiology ?No results found. ? ?Procedures ?Procedures  ? ? ?Medications Ordered in ED ?Medications - No data to display ? ?ED Course/ Medical Decision Making/ A&P ?  ?                        ?Medical Decision Making ? ?66 year old lady with history of hypertension presented to ER with concern for elevated blood pressure.  In office a week ago her blood pressure was mildly elevated and patient since then has been monitoring her blood pressure more closely at home.  She noted it had been as high as 604V to 409W systolic.  This prompted her visit today.  She had endorsed slight pain at the back of her head/neck earlier today however this has since gone away.  She has no symptoms right now.  Her blood pressure is 182/99 in triage.  Given patient has no ongoing symptoms right now and appears remarkably well-appearing, do not feel she needs repeat lab work today.  Recently had basic screening labs done a week ago that were all normal.  Given that she has no cardiorespiratory complaints, do not feel she needs EKG or further cardiac testing.  Given that she has no ongoing headache, the pain in the back of her head was not severe or sudden onset, not worst headache of her life, do not feel she needs CT head imaging at this time. ? ?I recommended that patient  continue to monitor her blood pressure at home but follow-up with her primary care doctor to discuss long-term management.  I reviewed return precautions should she develop new or concerning symptoms. ? ?Completed chart review, reviewed recent PCP visit on 3/29 -BP at the time was 138/88.  Basic labs including BMP, lipid panel, CBC, TSH, hepatic function panel were obtained on 3/29.  I reviewed these labs, she had normal kidney function, no anemia,  normal TSH. ? ? ? ?After the discussed management above, the patient was determined to be safe for discharge.  The patient was in agreement with this plan and all questions regarding their care were answered.  ED return precautions were discussed and the patient will return to the ED with any significant worsening of condition. ? ? ? ? ? ? ? ? ?Final Clinical Impression(s) / ED Diagnoses ?Final diagnoses:  ?Hypertension, unspecified type  ? ? ?Rx / DC Orders ?ED Discharge Orders   ? ? None  ? ?  ? ? ?  ?Lucrezia Starch, MD ?05/05/21 1652 ? ?

## 2021-05-05 NOTE — ED Triage Notes (Signed)
Pt arrives with c/o hypertension that started a couple of days ago. Pt is on one BP meds. Pt sent here by PCP. Pt c/o headache.  ?

## 2021-05-05 NOTE — Discharge Instructions (Signed)
Please follow-up with your primary care doctor to discuss management of your blood pressure.  ? ?If you develop bad headache, any chest pain or difficulty breathing, palpitations, lightheadedness, vomiting or other new concerning symptom, please return to ER for reassessment and further testing at that time. ?

## 2021-05-08 ENCOUNTER — Other Ambulatory Visit (HOSPITAL_COMMUNITY): Payer: Self-pay

## 2021-05-08 ENCOUNTER — Other Ambulatory Visit: Payer: Self-pay

## 2021-05-08 ENCOUNTER — Emergency Department (INDEPENDENT_AMBULATORY_CARE_PROVIDER_SITE_OTHER)
Admission: EM | Admit: 2021-05-08 | Discharge: 2021-05-08 | Disposition: A | Discharge status: Home / Self Care | Payer: Medicare Other | Source: Home / Self Care | Attending: Family Medicine | Admitting: Family Medicine

## 2021-05-08 DIAGNOSIS — I10 Essential (primary) hypertension: Secondary | ICD-10-CM

## 2021-05-08 MED ORDER — HYDROCHLOROTHIAZIDE 25 MG PO TABS
25.0000 mg | ORAL_TABLET | Freq: Every day | ORAL | 0 refills | Status: DC
  Filled 2021-05-08: qty 30, 30d supply, fill #0

## 2021-05-08 NOTE — ED Triage Notes (Signed)
Pt states that she has been having high blood pressure readings for 1 week. ? ?Pt states that her her pcp is aware that her blood pressure is running high. Pt was told to be seen at urgent care.  ?

## 2021-05-08 NOTE — ED Provider Notes (Signed)
?[place] ? ? ? ?CSN: [id number] ?Arrival date & time: 05/08/21  1004 ? ? ?  ? ?History   ?Chief Complaint ?Chief Complaint  ?Patient presents with  ? Hypertension  ?  Blood pressure is running high. Pt states that she has a headache as well.  ? ? ?HPI ?Brianna Jackson is a 66 y.o. female.  ? ?HPI ? ?Very pleasant 66 year old woman.  Retired Marine scientist.  Has had hypertension for many years.  Normally well controlled on lisinopril 20 mg a day.  She saw her primary care doctor last month and her blood pressure was slightly elevated.  In the 130/88 range.  Her primary care doctor asked her to continue to follow her blood pressure and bring him a log of the results.  She has had some elevated blood pressure readings in the last several days.  She has called her primary care doctor but was unable to get an appointment.  She states that she was advised to go to an urgent care if she had concerns.  She ended up going to the emergency room on 05/05/2021.  The doctor advised her to follow-up with her primary care doctor and made no changes to her regimen.  She has had recent blood work on 04/20/2021 with normal liver kidney functions, TSH, metabolic panel.  Patient was not feeling that this doctor thought her complaints trivial. ?She has not had any significant headache, visual changes, she has had some nausea.  She remarks that she is anxious about her elevated blood pressure.  She has had no chest pain or shortness of breath.  No change in ability to exercise ? ?Past Medical History:  ?Diagnosis Date  ? Allergy   ? seasonal  ? Chicken pox   ? GERD (gastroesophageal reflux disease)   ? not diagnosed  ? Heart murmur   ? VSD, ECHO 2010  ? History of MRSA infection 2010, 2012  ? Hyperlipidemia   ? Hypertension   ? Positive TB test   ? treated with INH childhood  ? Thyroid disease   ? hypothyroid  ? Urine incontinence   ? UTI (urinary tract infection)   ? ? ?Patient Active Problem List  ? Diagnosis Date Noted  ? Acute  bronchitis 11/02/2011  ? Hypothyroid 03/12/2011  ? Hypertension 03/12/2011  ? Ventricular septal defect 03/12/2011  ? 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 ? ?Past Surgical History:  ?Procedure Laterality Date  ? COLONOSCOPY  01/30/2004  ? COLONOSCOPY  11/2014  ? SA SSP  ? MOLE REMOVAL  01/29/2006  ? MRSA  2010, 2012  ? absess  ? POLYPECTOMY    ? STRABISMUS SURGERY  01/29/1962  ? TONSILLECTOMY    ? ? ?OB History   ?No obstetric history on file. ?  ? ? ? ?Home Medications   ? ?Prior to Admission medications   ?Medication Sig Start Date End Date Taking? Authorizing Provider  ?albuterol (PROAIR HFA) 108 (90 Base) MCG/ACT inhaler Inhale 1-2 puffs into the lungs every 4 (four) hours as needed for wheezing or shortness of breath. 02/19/17  Yes Burchette, Alinda Sierras, MD  ?Calcium Carbonate-Vitamin D (CALCIUM-VITAMIN D) 500-200 MG-UNIT per tablet Take 1 tablet by mouth 2 (two) times daily with a meal.   Yes [provider]  ?hydrochlorothiazide (HYDRODIURIL) 25 MG tablet Take 1 tablet (25 mg total) by mouth daily. 05/08/21  Yes Raylene Everts, MD  ?levothyroxine (SYNTHROID) 100 MCG tablet TAKE 1 TABLET BY MOUTH ONCE DAILY 04/26/21 04/26/22  Yes Burchette, Alinda Sierras, MD  ?lisinopril (ZESTRIL) 20 MG tablet TAKE 1 TABLET BY MOUTH ONCE A DAY 04/26/21 04/26/22 Yes Burchette, Alinda Sierras, MD  ?vitamin B-12 (CYANOCOBALAMIN) 100 MCG tablet Take 100 mcg by mouth daily.   Yes [provider]  ?metoprolol tartrate (LOPRESSOR) 100 MG tablet Take 1 tablet by mouth 2 hours prior to Cardiac CT 11/23/19 04/13/20  Jettie Booze, MD  ? ? ?Family History ?Family History  ?Problem Relation Age of Onset  ? Cancer Mother   ?     lung cancer-never found primary sight   ? Alcohol abuse Father   ? Cirrhosis Father   ? Colon cancer Neg Hx   ? Colon polyps Neg Hx   ? Rectal cancer Neg Hx   ? Stomach cancer Neg Hx   ? Esophageal cancer Neg Hx   ? Breast cancer Neg Hx   ? ? ?Social History ?Social History  ? ?Tobacco Use  ? Smoking status:  Never  ? Smokeless tobacco: Never  ?Vaping Use  ? Vaping Use: Never used  ?Substance Use Topics  ? Alcohol use: Yes  ?  Alcohol/week: 0.0 standard drinks  ?  Comment: rare socially  ? Drug use: No  ? ? ? ?Allergies   ?Other and Wheat ? ? ?Review of Systems ?Review of Systems ?See HPI ? ?Physical Exam ?Triage Vital Signs ?ED Triage Vitals  ?Enc Vitals Group  ?   BP 05/08/21 1021 (!) 183/106  ?   Pulse Rate 05/08/21 1021 79  ?   Resp 05/08/21 1021 18  ?   Temp 05/08/21 1021 98 ?F (36.7 ?C)  ?   Temp Source 05/08/21 1021 Oral  ?   SpO2 05/08/21 1021 98 %  ?   Weight 05/08/21 1019 133 lb (60.3 kg)  ?   Height 05/08/21 1019 5' (1.524 m)  ?   Head Circumference --   ?   Peak Flow --   ?   Pain Score 05/08/21 1018 4  ?   Pain Loc --   ?   Pain Edu? --   ?   Excl. in [place]? --   ? ?No data found. ? ?Updated Vital Signs ?BP (!) 160/120 (BP Location: Right Arm)   Pulse 79   Temp 98 ?F (36.7 ?C) (Oral)   Resp 18   Ht 5' (1.524 m)   Wt 60.3 kg   SpO2 98%   BMI 25.97 kg/m?  ?   ? ?Physical Exam ?Constitutional:   ?   General: She is not in acute distress. ?   Appearance: Normal appearance. She is well-developed and normal weight.  ?HENT:  ?   Head: Normocephalic and atraumatic.  ?   Right Ear: Tympanic membrane and ear canal normal.  ?   Left Ear: Tympanic membrane and ear canal normal.  ?   Nose: Nose normal. No congestion.  ?   Mouth/Throat:  ?   Mouth: Mucous membranes are moist.  ?   Pharynx: No posterior oropharyngeal erythema.  ?Eyes:  ?   Conjunctiva/sclera: Conjunctivae normal.  ?   Pupils: Pupils are equal, round, and reactive to light.  ?   Comments: Fundi are benign  ?Neck:  ?   Vascular: Carotid bruit present.  ?Cardiovascular:  ?   Rate and Rhythm: Normal rate and regular rhythm.  ?   Heart sounds: Murmur heard.  ?   Comments: Holosystolic murmur ?Pulmonary:  ?   Effort: Pulmonary effort is normal. No  respiratory distress.  ?   Breath sounds: No rhonchi.  ?Abdominal:  ?   General: Abdomen is flat. There is no  distension.  ?   Palpations: Abdomen is soft.  ?   Tenderness: There is no abdominal tenderness.  ?   Comments: no organomegaly  ?Musculoskeletal:     ?   General: Normal range of motion.  ?   Cervical back: Normal range of motion and neck supple.  ?   Right lower leg: No edema.  ?   Left lower leg: No edema.  ?Lymphadenopathy:  ?   Cervical: No cervical adenopathy.  ?Skin: ?   General: Skin is warm and dry.  ?Neurological:  ?   General: No focal deficit present.  ?   Mental Status: She is alert.  ?Psychiatric:     ?   Mood and Affect: Mood normal.     ?   Behavior: Behavior normal.  ? ? ? ?UC Treatments / Results  ?Labs ?(all labs ordered are listed, but only abnormal results are displayed) ?Labs Reviewed - No data to display ? ?EKG ? ? ?Radiology ?No results found. ? ?Procedures ?Procedures (including critical care time) ? ?Medications Ordered in UC ?Medications - No data to display ? ?Initial Impression / Assessment and Plan / UC Course  ?I have reviewed the triage vital signs and the nursing notes. ? ?Pertinent labs & imaging results that were available during my care of the patient were reviewed by me and considered in my medical decision making (see chart for details). ? ?  ?I discussed with the patient that her blood pressure is higher than is desirable for her longstanding resting blood pressure, but not immediately dangerous.  I am going to add hydrochlorothiazide to her regimen.  She will take this daily.  Continue to follow her blood pressure 1 or 2 times a day.  See her primary care doctor in a week ?Final Clinical Impressions(s) / UC Diagnoses  ? ?Final diagnoses:  ?Primary hypertension  ? ? ? ?Discharge Instructions   ? ?  ? ?Continue the lisinopril one time a day ?Add HCTZ once a day ?Continue to take your BP once or twice a day ?Make an appt with your PCP for next week or two ? ? ?ED Prescriptions   ? ? Medication Sig Dispense Auth. Provider  ? hydrochlorothiazide (HYDRODIURIL) 25 MG tablet Take 1  tablet (25 mg total) by mouth daily. 30 tablet Raylene Everts, MD  ? ?  ? ?PDMP not reviewed this encounter. ?  ?Raylene Everts, MD ?05/08/21 1140 ? ?

## 2021-05-08 NOTE — Discharge Instructions (Addendum)
?  Continue the lisinopril one time a day ?Add HCTZ once a day ?Continue to take your BP once or twice a day ?Make an appt with your PCP for next week or two ?

## 2021-05-19 ENCOUNTER — Ambulatory Visit (INDEPENDENT_AMBULATORY_CARE_PROVIDER_SITE_OTHER): Payer: Medicare Other | Admitting: Family Medicine

## 2021-05-19 ENCOUNTER — Encounter: Payer: Self-pay | Admitting: Family Medicine

## 2021-05-19 ENCOUNTER — Other Ambulatory Visit (HOSPITAL_COMMUNITY): Payer: Self-pay

## 2021-05-19 VITALS — BP 140/88 | HR 74 | Temp 97.8°F | Ht 60.0 in | Wt 131.3 lb

## 2021-05-19 DIAGNOSIS — I10 Essential (primary) hypertension: Secondary | ICD-10-CM | POA: Diagnosis not present

## 2021-05-19 LAB — BASIC METABOLIC PANEL
BUN: 14 mg/dL (ref 6–23)
CO2: 31 mEq/L (ref 19–32)
Calcium: 9.6 mg/dL (ref 8.4–10.5)
Chloride: 97 mEq/L (ref 96–112)
Creatinine, Ser: 1.04 mg/dL (ref 0.40–1.20)
GFR: 55.8 mL/min — ABNORMAL LOW (ref >60.00)
Glucose, Bld: 89 mg/dL (ref 70–99)
Potassium: 3.8 mEq/L (ref 3.5–5.1)
Sodium: 135 mEq/L (ref 135–145)

## 2021-05-19 MED ORDER — CHLORTHALIDONE 25 MG PO TABS
25.0000 mg | ORAL_TABLET | Freq: Every day | ORAL | 1 refills | Status: DC
Start: 2021-05-19 — End: 2021-06-19
  Filled 2021-05-19: qty 30, 30d supply, fill #0
  Filled 2021-06-12: qty 30, 30d supply, fill #1

## 2021-05-19 NOTE — Progress Notes (Signed)
?Subjective:  ?  ? Patient ID: Brianna Jackson, female   DOB: [birth year], 66 y.o.   MRN: [id number] ? ?HPI ?Latresha is seen following couple recent visits to ER and then urgent care on 05-05-2021 and 05-08-2021.  She has history of hypertension and for years has been treated and fairly well controlled with lisinopril 20 mg daily.  Friday before Easter she felt somewhat poorly and noted her blood pressure was up.  She went to ER on the seventh and had blood pressure 182/99.  She had been seen here recently and had multiple labs which were stable.  No new medications were added at that time.  She was then seen in urgent care a few days later and blood pressure remained elevated and they added HCTZ 25 mg daily to her lisinopril.  I took several days for blood pressure start to come down but the past few days this has been some improved. ? ?Denies any peripheral edema.  No headaches.  No recent dietary changes.  No regular alcohol use.  She has been walking some for exercise. ? ?Past Medical History:  ?Diagnosis Date  ? Allergy   ? seasonal  ? Chicken pox   ? GERD (gastroesophageal reflux disease)   ? not diagnosed  ? Heart murmur   ? VSD, ECHO 2010  ? History of MRSA infection 2010, 2012  ? Hyperlipidemia   ? Hypertension   ? Positive TB test   ? treated with INH childhood  ? Thyroid disease   ? hypothyroid  ? Urine incontinence   ? UTI (urinary tract infection)   ? ?Past Surgical History:  ?Procedure Laterality Date  ? COLONOSCOPY  01/30/2004  ? COLONOSCOPY  11/2014  ? SA SSP  ? MOLE REMOVAL  01/29/2006  ? MRSA  2010, 2012  ? absess  ? POLYPECTOMY    ? STRABISMUS SURGERY  01/29/1962  ? TONSILLECTOMY    ? ? reports that she has never smoked. She has never used smokeless tobacco. She reports current alcohol use. She reports that she does not use drugs. ?family history includes Alcohol abuse in her father; Cancer in her mother; Cirrhosis in her father. ?Allergies  ?Allergen Reactions  ? Other Hives  ?  Nuts ?  ? Wheat   ?   Gluten intollerance  ?] ?Wt Readings from Last 3 Encounters:  ?05/19/21 131 lb 4.8 oz (59.6 kg)  ?05/08/21 133 lb (60.3 kg)  ?05/05/21 133 lb (60.3 kg)  ? ? ? ? ?Review of Systems  ?Constitutional:  Negative for fatigue and unexpected weight change.  ?Eyes:  Negative for visual disturbance.  ?Respiratory:  Negative for cough, chest tightness, shortness of breath and wheezing.   ?Cardiovascular:  Negative for chest pain, palpitations and leg swelling.  ?Neurological:  Negative for dizziness, seizures, syncope, weakness, light-headedness and headaches.  ? ?   ?Objective:  ? Physical Exam ? ?   ?Assessment:  ?   ?Hypertension.  Recent poor control.  Repeat today left arm seated after rest 140/88.  Recent addition of HCTZ 25 mg daily to her lisinopril 20 mg daily ?   ?Plan:  ?   ?-Continue nonpharmacologic management with regular aerobic exercise such as walking and trying to keep daily sodium intake less than 2500 mg ?-We did discuss possible change from HCTZ to chlorthalidone 25 mg daily which will hopefully be somewhat more potent. ?-Her blood pressure has trended down past few days and recommend continued monitoring.  Also needs basic metabolic panel  today with recent initiation of HCTZ. ?-If blood pressure not consistently less than 140/90 with chlorthalidone over the next few weeks consider addition of calcium channel blocker such as amlodipine ? ?Eulas Post MD ?[facility] ? ?   ?

## 2021-05-26 ENCOUNTER — Ambulatory Visit (INDEPENDENT_AMBULATORY_CARE_PROVIDER_SITE_OTHER): Payer: Medicare Other

## 2021-05-26 VITALS — BP 125/74 | Ht 60.0 in | Wt 131.0 lb

## 2021-05-26 DIAGNOSIS — Z0000 Encounter for general adult medical examination without abnormal findings: Secondary | ICD-10-CM

## 2021-05-26 NOTE — Patient Instructions (Addendum)
?[name] , ?Thank you for taking time to come for your Medicare Wellness Visit. I appreciate your ongoing commitment to your health goals. Please review the following plan we discussed and let me know if I can assist you in the future.  ? ?These are the goals we discussed: ? Goals   ? ?   Weight (lb) < 5 lb (2.3 kg) (pt-stated)   ?   Lose another 5lb ?  ? ?  ?  ?This is a list of the screening recommended for you and due dates:  ?Health Maintenance  ?Topic Date Due  ? COVID-19 Vaccine (2 - Pfizer series) 06/11/2021*  ? Tetanus Vaccine  05/27/2022*  ? Flu Shot  08/29/2021  ? Mammogram  03/09/2023  ? Colon Cancer Screening  07/20/2025  ? Pneumonia Vaccine  Completed  ? DEXA scan (bone density measurement)  Completed  ? Hepatitis C Screening: USPSTF Recommendation to screen - Ages 29-79 yo.  Completed  ? Zoster (Shingles) Vaccine  Completed  ? HPV Vaccine  Aged Out  ?*Topic was postponed. The date shown is not the original due date.  ? ?Advanced directives: No  ? ?Conditions/risks identified: None ? ?Next appointment: Follow up in one year for your annual wellness visit  ? ? ?Preventive Care 56 Years and Older, Female ?Preventive care refers to lifestyle choices and visits with your health care provider that can promote health and wellness. ?What does preventive care include? ?A yearly physical exam. This is also called an annual well check. ?Dental exams once or twice a year. ?Routine eye exams. Ask your health care provider how often you should have your eyes checked. ?Personal lifestyle choices, including: ?Daily care of your teeth and gums. ?Regular physical activity. ?Eating a healthy diet. ?Avoiding tobacco and drug use. ?Limiting alcohol use. ?Practicing safe sex. ?Taking low-dose aspirin every day. ?Taking vitamin and mineral supplements as recommended by your health care provider. ?What happens during an annual well check? ?The services and screenings done by your health care provider during your annual  well check will depend on your age, overall health, lifestyle risk factors, and family history of disease. ?Counseling  ?Your health care provider may ask you questions about your: ?Alcohol use. ?Tobacco use. ?Drug use. ?Emotional well-being. ?Home and relationship well-being. ?Sexual activity. ?Eating habits. ?History of falls. ?Memory and ability to understand (cognition). ?Work and work Statistician. ?Reproductive health. ?Screening  ?You may have the following tests or measurements: ?Height, weight, and BMI. ?Blood pressure. ?Lipid and cholesterol levels. These may be checked every 5 years, or more frequently if you are over 59 years old. ?Skin check. ?Lung cancer screening. You may have this screening every year starting at age 61 if you have a 30-pack-year history of smoking and currently smoke or have quit within the past 15 years. ?Fecal occult blood test (FOBT) of the stool. You may have this test every year starting at age 62. ?Flexible sigmoidoscopy or colonoscopy. You may have a sigmoidoscopy every 5 years or a colonoscopy every 10 years starting at age 55. ?Hepatitis C blood test. ?Hepatitis B blood test. ?Sexually transmitted disease (STD) testing. ?Diabetes screening. This is done by checking your blood sugar (glucose) after you have not eaten for a while (fasting). You may have this done every 1-3 years. ?Bone density scan. This is done to screen for osteoporosis. You may have this done starting at age 14. ?Mammogram. This may be done every 1-2 years. Talk to your health care provider about how  often you should have regular mammograms. ?Talk with your health care provider about your test results, treatment options, and if necessary, the need for more tests. ?Vaccines  ?Your health care provider may recommend certain vaccines, such as: ?Influenza vaccine. This is recommended every year. ?Tetanus, diphtheria, and acellular pertussis (Tdap, Td) vaccine. You may need a Td booster every 10 years. ?Zoster  vaccine. You may need this after age 2. ?Pneumococcal 13-valent conjugate (PCV13) vaccine. One dose is recommended after age 43. ?Pneumococcal polysaccharide (PPSV23) vaccine. One dose is recommended after age 44. ?Talk to your health care provider about which screenings and vaccines you need and how often you need them. ?This information is not intended to replace advice given to you by your health care provider. Make sure you discuss any questions you have with your health care provider. ?Document Released: 02/11/2015 Document Revised: 10/05/2015 Document Reviewed: 11/16/2014 ?Elsevier Interactive Patient Education ? 2017 [employer]. ? ?Fall Prevention in the Home ?Falls can cause injuries. They can happen to people of all ages. There are many things you can do to make your home safe and to help prevent falls. ?What can I do on the outside of my home? ?Regularly fix the edges of walkways and driveways and fix any cracks. ?Remove anything that might make you trip as you walk through a door, such as a raised step or threshold. ?Trim any bushes or trees on the path to your home. ?Use bright outdoor lighting. ?Clear any walking paths of anything that might make someone trip, such as rocks or tools. ?Regularly check to see if handrails are loose or broken. Make sure that both sides of any steps have handrails. ?Any raised decks and porches should have guardrails on the edges. ?Have any leaves, snow, or ice cleared regularly. ?Use sand or salt on walking paths during winter. ?Clean up any spills in your garage right away. This includes oil or grease spills. ?What can I do in the bathroom? ?Use night lights. ?Install grab bars by the toilet and in the tub and shower. Do not use towel bars as grab bars. ?Use non-skid mats or decals in the tub or shower. ?If you need to sit down in the shower, use a plastic, non-slip stool. ?Keep the floor dry. Clean up any water that spills on the floor as soon as it happens. ?Remove  soap buildup in the tub or shower regularly. ?Attach bath mats securely with double-sided non-slip rug tape. ?Do not have throw rugs and other things on the floor that can make you trip. ?What can I do in the bedroom? ?Use night lights. ?Make sure that you have a light by your bed that is easy to reach. ?Do not use any sheets or blankets that are too big for your bed. They should not hang down onto the floor. ?Have a firm chair that has side arms. You can use this for support while you get dressed. ?Do not have throw rugs and other things on the floor that can make you trip. ?What can I do in the kitchen? ?Clean up any spills right away. ?Avoid walking on wet floors. ?Keep items that you use a lot in easy-to-reach places. ?If you need to reach something above you, use a strong step stool that has a grab bar. ?Keep electrical cords out of the way. ?Do not use floor polish or wax that makes floors slippery. If you must use wax, use non-skid floor wax. ?Do not have throw rugs and other things  on the floor that can make you trip. ?What can I do with my stairs? ?Do not leave any items on the stairs. ?Make sure that there are handrails on both sides of the stairs and use them. Fix handrails that are broken or loose. Make sure that handrails are as long as the stairways. ?Check any carpeting to make sure that it is firmly attached to the stairs. Fix any carpet that is loose or worn. ?Avoid having throw rugs at the top or bottom of the stairs. If you do have throw rugs, attach them to the floor with carpet tape. ?Make sure that you have a light switch at the top of the stairs and the bottom of the stairs. If you do not have them, ask someone to add them for you. ?What else can I do to help prevent falls? ?Wear shoes that: ?Do not have high heels. ?Have rubber bottoms. ?Are comfortable and fit you well. ?Are closed at the toe. Do not wear sandals. ?If you use a stepladder: ?Make sure that it is fully opened. Do not climb a  closed stepladder. ?Make sure that both sides of the stepladder are locked into place. ?Ask someone to hold it for you, if possible. ?Clearly mark and make sure that you can see: ?Any grab bars or handrails

## 2021-05-26 NOTE — Progress Notes (Signed)
? ?Subjective:  ? Brianna Jackson is a 66 y.o. female who presents for Medicare Annual (Subsequent) preventive examination. ? ?Review of Systems    ?Virtual Visit via Telephone Note ? ?I connected with  Brianna Jackson on 05/26/21 at  8:15 AM EDT by telephone and verified that I am speaking with the correct person using two identifiers. ? ?Location: ?Patient: Home ?Provider: Office ?Persons participating in the virtual visit: patient/Nurse Health Advisor ?  ?I discussed the limitations, risks, security and privacy concerns of performing an evaluation and management service by telephone and the availability of in person appointments. The patient expressed understanding and agreed to proceed. ? ?Interactive audio and video telecommunications were attempted between this nurse and patient, however failed, due to patient having technical difficulties OR patient did not have access to video capability.  We continued and completed visit with audio only. ? ?Some vital signs may be absent or patient reported.  ? ?Criselda Peaches, LPN  ?Cardiac Risk Factors include: advanced age (>74mn, >>82women);hypertension ? ?   ?Objective:  ?  ?Today's Vitals  ? 05/26/21 0818  ?BP: 125/74  ?Weight: 131 lb (59.4 kg)  ?Height: 5' (1.524 m)  ? ?Body mass index is 25.58 kg/m?. ? ? ?  05/26/2021  ?  8:31 AM 05/05/2021  ?  3:57 PM 07/20/2020  ?  7:08 AM 11/14/2019  ? 11:50 AM 11/30/2014  ? 10:01 AM  ?Advanced Directives  ?Does Patient Have a Medical Advance Directive? No No No No No  ?Would patient like information on creating a medical advance directive? No - Patient declined No - Patient declined No - Patient declined No - Patient declined   ? ? ?Current Medications (verified) ?Outpatient Encounter Medications as of 05/26/2021  ?Medication Sig  ? albuterol (PROAIR HFA) 108 (90 Base) MCG/ACT inhaler Inhale 1-2 puffs into the lungs every 4 (four) hours as needed for wheezing or shortness of breath.  ? Calcium Carbonate-Vitamin D (CALCIUM-VITAMIN D)  500-200 MG-UNIT per tablet Take 1 tablet by mouth 2 (two) times daily with a meal.  ? chlorthalidone (HYGROTON) 25 MG tablet Take 1 tablet (25 mg total) by mouth daily.  ? levothyroxine (SYNTHROID) 100 MCG tablet TAKE 1 TABLET BY MOUTH ONCE DAILY  ? lisinopril (ZESTRIL) 20 MG tablet TAKE 1 TABLET BY MOUTH ONCE A DAY  ? vitamin B-12 (CYANOCOBALAMIN) 100 MCG tablet Take 100 mcg by mouth daily.  ? [DISCONTINUED] metoprolol tartrate (LOPRESSOR) 100 MG tablet Take 1 tablet by mouth 2 hours prior to Cardiac CT  ? ?No facility-administered encounter medications on file as of 05/26/2021.  ? ? ?Allergies (verified) ?Other and Wheat  ? ?History: ?Past Medical History:  ?Diagnosis Date  ? Allergy   ? seasonal  ? Chicken pox   ? GERD (gastroesophageal reflux disease)   ? not diagnosed  ? Heart murmur   ? VSD, ECHO 2010  ? History of MRSA infection 2010, 2012  ? Hyperlipidemia   ? Hypertension   ? Positive TB test   ? treated with INH childhood  ? Thyroid disease   ? hypothyroid  ? Urine incontinence   ? UTI (urinary tract infection)   ? ?Past Surgical History:  ?Procedure Laterality Date  ? COLONOSCOPY  01/30/2004  ? COLONOSCOPY  11/2014  ? SA SSP  ? MOLE REMOVAL  01/29/2006  ? MRSA  2010, 2012  ? absess  ? POLYPECTOMY    ? STRABISMUS SURGERY  01/29/1962  ? TONSILLECTOMY    ? ?  Family History  ?Problem Relation Age of Onset  ? Cancer Mother   ?     lung cancer-never found primary sight   ? Alcohol abuse Father   ? Cirrhosis Father   ? Colon cancer Neg Hx   ? Colon polyps Neg Hx   ? Rectal cancer Neg Hx   ? Stomach cancer Neg Hx   ? Esophageal cancer Neg Hx   ? Breast cancer Neg Hx   ? ?Social History  ? ?Socioeconomic History  ? Marital status: Divorced  ?  Spouse name: Not on file  ? Number of children: Not on file  ? Years of education: Not on file  ? Highest education level: Not on file  ?Occupational History  ? Not on file  ?Tobacco Use  ? Smoking status: Never  ? Smokeless tobacco: Never  ?Vaping Use  ? Vaping Use: Never  used  ?Substance and Sexual Activity  ? Alcohol use: Yes  ?  Alcohol/week: 0.0 standard drinks  ?  Comment: rare socially  ? Drug use: No  ? Sexual activity: Yes  ?Other Topics Concern  ? Not on file  ?Social History Narrative  ? Not on file  ? ?Social Determinants of Health  ? ?Financial Resource Strain: Low Risk   ? Difficulty of Paying Living Expenses: Not hard at all  ?Food Insecurity: No Food Insecurity  ? Worried About Charity fundraiser in the Last Year: Never true  ? Ran Out of Food in the Last Year: Never true  ?Transportation Needs: No Transportation Needs  ? Lack of Transportation (Medical): No  ? Lack of Transportation (Non-Medical): No  ?Physical Activity: Insufficiently Active  ? Days of Exercise per Week: 5 days  ? Minutes of Exercise per Session: 20 min  ?Stress: No Stress Concern Present  ? Feeling of Stress : Not at all  ?Social Connections: Moderately Integrated  ? Frequency of Communication with Friends and Family: More than three times a week  ? Frequency of Social Gatherings with Friends and Family: More than three times a week  ? Attends Religious Services: More than 4 times per year  ? Active Member of Clubs or Organizations: Yes  ? Attends Archivist Meetings: More than 4 times per year  ? Marital Status: Divorced  ? ? ?Tobacco Counseling ?Counseling given: Not Answered ? ? ?Clinical Intake: ? ?Pre-visit preparation completed: NoDiabetic?  No ? ?Interpreter Needed?: NoActivities of Daily Living ? ?  05/26/2021  ?  8:28 AM  ?In your present state of health, do you have any difficulty performing the following activities:  ?Hearing? 0  ?Vision? 0  ?Difficulty concentrating or making decisions? 0  ?Walking or climbing stairs? 0  ?Dressing or bathing? 0  ?Doing errands, shopping? 0  ?Preparing Food and eating ? N  ?Using the Toilet? N  ?In the past six months, have you accidently leaked urine? N  ?Do you have problems with loss of bowel control? N  ?Managing your Medications? N   ?Managing your Finances? N  ?Housekeeping or managing your Housekeeping? N  ? ? ?Patient Care Team: ?Eulas Post, MD as PCP - General (Family Medicine) ? ?Indicate any recent Medical Services you may have received from other than [facility] providers in the past year (date may be approximate). ? ?   ?Assessment:  ? This is a routine wellness examination for Brianna Jackson. ? ?Hearing/Vision screen ?Hearing Screening - Comments:: No difficulty hearing  ?Vision Screening - Comments:: Wears glasses. Followed  by Dr Valetta Close ? ?Dietary issues and exercise activities discussed: ?Exercise limited by: None identified ? ? Goals Addressed   ? ?  ?  ?  ?  ?  ? This Visit's Progress  ?   Weight (lb) < 5 lb (2.3 kg) (pt-stated)   131 lb (59.4 kg)  ?   Lose another 5lb ?  ? ?  ? ?Depression Screen ? ?  05/26/2021  ?  8:25 AM 05/19/2021  ? 11:50 AM 04/13/2020  ? 10:04 AM 04/13/2019  ? 10:20 AM 02/26/2018  ?  1:26 PM  ?PHQ 2/9 Scores  ?PHQ - 2 Score 0 0 0 0 0  ?PHQ- 9 Score     0  ?  ?Fall Risk ? ?  05/26/2021  ?  8:28 AM  ?Fall Risk   ?Falls in the past year? 1  ?Number falls in past yr: 0  ?Injury with Fall? 0  ?Comment No injuries or medical attention needed  ?Risk for fall due to : No Fall Risks  ? ? ?FALL RISK PREVENTION PERTAINING TO THE HOME: ? ?Any stairs in or around the home? No  ?If so, are there any without handrails? No  ?Home free of loose throw rugs in walkways, pet beds, electrical cords, etc? Yes  ?Adequate lighting in your home to reduce risk of falls? Yes  ? ?ASSISTIVE DEVICES UTILIZED TO PREVENT FALLS: ? ?Life alert? No  ?Use of a cane, walker or w/c? No  ?Grab bars in the bathroom? No  ?Shower chair or bench in shower? Yes  ?Elevated toilet seat or a handicapped toilet? No  ? ?TIMED UP AND GO: ? ?Was the test performed? No .  ? ?Cognitive Function: ?  ?  ? ?  05/26/2021  ?  8:32 AM  ?6CIT Screen  ?What Year? 0 points  ?What month? 0 points  ?What time? 0 points  ?Count back from 20 0 points  ?Months in reverse 0 points   ?Repeat phrase 0 points  ?Total Score 0 points  ? ? ?Immunizations ?Immunization History  ?Administered Date(s) Administered  ? Influenza Split 09/29/2013  ? Influenza, Quadrivalent, Recombinant, Inj, Pf 10/22/2018

## 2021-06-12 ENCOUNTER — Other Ambulatory Visit (HOSPITAL_COMMUNITY): Payer: Self-pay

## 2021-06-19 ENCOUNTER — Ambulatory Visit (INDEPENDENT_AMBULATORY_CARE_PROVIDER_SITE_OTHER): Payer: Medicare Other | Admitting: Family Medicine

## 2021-06-19 ENCOUNTER — Other Ambulatory Visit (HOSPITAL_COMMUNITY): Payer: Self-pay

## 2021-06-19 ENCOUNTER — Encounter: Payer: Self-pay | Admitting: Family Medicine

## 2021-06-19 VITALS — BP 116/68 | HR 79 | Temp 97.6°F | Ht 60.0 in | Wt 130.2 lb

## 2021-06-19 DIAGNOSIS — Z79899 Other long term (current) drug therapy: Secondary | ICD-10-CM | POA: Diagnosis not present

## 2021-06-19 DIAGNOSIS — Z5181 Encounter for therapeutic drug level monitoring: Secondary | ICD-10-CM | POA: Diagnosis not present

## 2021-06-19 LAB — BASIC METABOLIC PANEL
BUN: 20 mg/dL (ref 6–23)
CO2: 34 mEq/L — ABNORMAL HIGH (ref 19–32)
Calcium: 10.1 mg/dL (ref 8.4–10.5)
Chloride: 95 mEq/L — ABNORMAL LOW (ref 96–112)
Creatinine, Ser: 1.12 mg/dL (ref 0.40–1.20)
GFR: 51.02 mL/min — ABNORMAL LOW (ref >60.00)
Glucose, Bld: 96 mg/dL (ref 70–99)
Potassium: 3.1 mEq/L — ABNORMAL LOW (ref 3.5–5.1)
Sodium: 136 mEq/L (ref 135–145)

## 2021-06-19 MED ORDER — CHLORTHALIDONE 25 MG PO TABS
25.0000 mg | ORAL_TABLET | Freq: Every day | ORAL | 3 refills | Status: DC
  Filled 2021-06-19 – 2021-07-17 (×2): qty 90, 90d supply, fill #0
  Filled 2021-10-09: qty 90, 90d supply, fill #1
  Filled 2022-01-11 – 2022-01-12 (×2): qty 90, 90d supply, fill #2
  Filled 2022-04-20: qty 90, 90d supply, fill #3

## 2021-06-19 NOTE — Progress Notes (Signed)
Established Patient Office Visit  Subjective   Patient ID: KLANI CARIDI, female    DOB: 1955-05-29  Age: 66 y.o. MRN: [id number]  Chief Complaint  Patient presents with   Follow-up    HPI   Here for follow-up hypertension.  She had exacerbation several weeks ago which prompted 1 ER visit.  We ended up switching her HCTZ to chlorthalidone 25 mg daily.  Tolerating well.  Remains on lisinopril 20 mg daily.  Home blood pressures been excellent mostly 297 systolic and 98X diastolic.  No orthostatic symptoms.  No dizziness.  She is trying to watch her diet closely.  Past Medical History:  Diagnosis Date   Allergy    seasonal   Chicken pox    GERD (gastroesophageal reflux disease)    not diagnosed   Heart murmur    VSD, ECHO 2010   History of MRSA infection 2010, 2012   Hyperlipidemia    Hypertension    Positive TB test    treated with INH childhood   Thyroid disease    hypothyroid   Urine incontinence    UTI (urinary tract infection)    Past Surgical History:  Procedure Laterality Date   COLONOSCOPY  01/30/2004   COLONOSCOPY  11/2014   SA SSP   MOLE REMOVAL  01/29/2006   MRSA  2010, 2012   absess   POLYPECTOMY     STRABISMUS SURGERY  01/29/1962   TONSILLECTOMY      reports that she has never smoked. She has never used smokeless tobacco. She reports current alcohol use. She reports that she does not use drugs. family history includes Alcohol abuse in her father; Cancer in her mother; Cirrhosis in her father. Allergies  Allergen Reactions   Other Hives    Nuts    Wheat     Gluten intollerance    Review of Systems  Respiratory:  Negative for shortness of breath.   Cardiovascular:  Negative for chest pain.  Neurological:  Negative for dizziness.     Objective:     BP 116/68 (BP Location: Left Arm, Cuff Size: Normal)   Pulse 79   Temp 97.6 F (36.4 C) (Oral)   Ht 5' (1.524 m)   Wt 130 lb 3.2 oz (59.1 kg)   SpO2 99%   BMI 25.43 kg/m    Physical  Exam Vitals reviewed.  Constitutional:      Appearance: Normal appearance.  Cardiovascular:     Rate and Rhythm: Normal rate and regular rhythm.     Heart sounds: Murmur heard.  Pulmonary:     Effort: Pulmonary effort is normal.     Breath sounds: Normal breath sounds. No wheezing or rales.  Musculoskeletal:     Right lower leg: No edema.     Left lower leg: No edema.  Neurological:     Mental Status: She is alert.     No results found for any visits on 06/19/21.    The 10-year ASCVD risk score (Arnett DK, et al., 2019) is: 7.6%    Assessment & Plan:   Problem List Items Addressed This Visit   None Visit Diagnoses     Encounter for monitoring diuretic therapy    -  Primary   Relevant Orders   Basic metabolic panel     Hypertension improved with recent addition of chlorthalidone to her lisinopril.  We will check basic metabolic panel to rule out any electrolyte disturbance.  If stable recommend follow-up in 1 year.  Refilled  chlorthalidone for 1 year  No follow-ups on file.    Carolann Littler, MD

## 2021-06-20 ENCOUNTER — Other Ambulatory Visit (HOSPITAL_COMMUNITY): Payer: Self-pay

## 2021-06-20 MED ORDER — POTASSIUM CHLORIDE ER 10 MEQ PO TBCR
EXTENDED_RELEASE_TABLET | ORAL | 0 refills | Status: DC
  Filled 2021-06-20: qty 180, 90d supply, fill #0

## 2021-06-20 NOTE — Addendum Note (Signed)
Addended by: Nilda Riggs on: 06/20/2021 02:10 PM   Modules accepted: Orders

## 2021-06-29 ENCOUNTER — Encounter: Payer: Self-pay | Admitting: Family Medicine

## 2021-07-13 ENCOUNTER — Other Ambulatory Visit (INDEPENDENT_AMBULATORY_CARE_PROVIDER_SITE_OTHER): Payer: Medicare Other

## 2021-07-13 ENCOUNTER — Other Ambulatory Visit: Payer: Self-pay

## 2021-07-13 DIAGNOSIS — E876 Hypokalemia: Secondary | ICD-10-CM | POA: Diagnosis not present

## 2021-07-13 LAB — BASIC METABOLIC PANEL
BUN: 16 mg/dL (ref 6–23)
CO2: 30 mEq/L (ref 19–32)
Calcium: 9.6 mg/dL (ref 8.4–10.5)
Chloride: 99 mEq/L (ref 96–112)
Creatinine, Ser: 1.03 mg/dL (ref 0.40–1.20)
GFR: 56.39 mL/min — ABNORMAL LOW (ref >60.00)
Glucose, Bld: 83 mg/dL (ref 70–99)
Potassium: 3.4 mEq/L — ABNORMAL LOW (ref 3.5–5.1)
Sodium: 138 mEq/L (ref 135–145)

## 2021-07-17 ENCOUNTER — Other Ambulatory Visit (HOSPITAL_COMMUNITY): Payer: Self-pay

## 2021-09-11 ENCOUNTER — Other Ambulatory Visit: Payer: Self-pay | Admitting: Family Medicine

## 2021-09-11 ENCOUNTER — Other Ambulatory Visit (HOSPITAL_COMMUNITY): Payer: Self-pay

## 2021-09-11 MED ORDER — POTASSIUM CHLORIDE ER 10 MEQ PO TBCR
EXTENDED_RELEASE_TABLET | ORAL | 0 refills | Status: DC
  Filled 2021-09-11: qty 180, 90d supply, fill #0

## 2021-10-09 ENCOUNTER — Other Ambulatory Visit (HOSPITAL_COMMUNITY): Payer: Self-pay

## 2021-10-16 ENCOUNTER — Ambulatory Visit (INDEPENDENT_AMBULATORY_CARE_PROVIDER_SITE_OTHER): Payer: Medicare Other | Admitting: Family Medicine

## 2021-10-16 ENCOUNTER — Encounter: Payer: Self-pay | Admitting: Family Medicine

## 2021-10-16 VITALS — BP 118/72 | HR 67 | Temp 98.1°F | Wt 131.1 lb

## 2021-10-16 DIAGNOSIS — E876 Hypokalemia: Secondary | ICD-10-CM

## 2021-10-16 DIAGNOSIS — Z23 Encounter for immunization: Secondary | ICD-10-CM | POA: Diagnosis not present

## 2021-10-16 DIAGNOSIS — I10 Essential (primary) hypertension: Secondary | ICD-10-CM

## 2021-10-16 LAB — BASIC METABOLIC PANEL
BUN: 16 mg/dL (ref 6–23)
CO2: 30 mEq/L (ref 19–32)
Calcium: 9.8 mg/dL (ref 8.4–10.5)
Chloride: 101 mEq/L (ref 96–112)
Creatinine, Ser: 1.07 mg/dL (ref 0.40–1.20)
GFR: 53.78 mL/min — ABNORMAL LOW (ref >60.00)
Glucose, Bld: 92 mg/dL (ref 70–99)
Potassium: 3.6 mEq/L (ref 3.5–5.1)
Sodium: 140 mEq/L (ref 135–145)

## 2021-10-16 NOTE — Addendum Note (Signed)
Addended by: Nilda Riggs on: 10/16/2021 02:16 PM   Modules accepted: Orders

## 2021-10-16 NOTE — Progress Notes (Signed)
Established Patient Office Visit  Subjective   Patient ID: Brianna Jackson, female    DOB: 02/06/1955  Age: 66 y.o. MRN: [id number]  Chief Complaint  Patient presents with   Follow-up    HPI   Here for follow-up hypertension and hypokalemia.  She is on chlorthalidone 25 mg daily and lisinopril 20 mg daily.  Last potassium 3 months ago was 3.4.  We increased her K-Lor to 10 mill equivalents 2 tablets daily.  She is also trying to eat high potassium diet.  Generally doing well.  No dizziness.  Home blood pressures well controlled with systolics around 481 and diastolics around 70  Past Medical History:  Diagnosis Date   Allergy    seasonal   Chicken pox    GERD (gastroesophageal reflux disease)    not diagnosed   Heart murmur    VSD, ECHO 2010   History of MRSA infection 2010, 2012   Hyperlipidemia    Hypertension    Positive TB test    treated with INH childhood   Thyroid disease    hypothyroid   Urine incontinence    UTI (urinary tract infection)    Past Surgical History:  Procedure Laterality Date   COLONOSCOPY  01/30/2004   COLONOSCOPY  11/2014   SA SSP   MOLE REMOVAL  01/29/2006   MRSA  2010, 2012   absess   POLYPECTOMY     STRABISMUS SURGERY  01/29/1962   TONSILLECTOMY      reports that she has never smoked. She has never used smokeless tobacco. She reports current alcohol use. She reports that she does not use drugs. family history includes Alcohol abuse in her father; Cancer in her mother; Cirrhosis in her father. Allergies  Allergen Reactions   Other Hives    Nuts    Wheat     Gluten intollerance    Review of Systems  Constitutional:  Negative for malaise/fatigue.  Eyes:  Negative for blurred vision.  Respiratory:  Negative for shortness of breath.   Cardiovascular:  Negative for chest pain.  Neurological:  Negative for dizziness, weakness and headaches.      Objective:     BP 118/72 (BP Location: Left Arm, Patient Position: Sitting, Cuff  Size: Normal)   Pulse 67   Temp 98.1 F (36.7 C) (Oral)   Wt 131 lb 1.6 oz (59.5 kg)   SpO2 98%   BMI 25.60 kg/m    Physical Exam Vitals reviewed.  Constitutional:      Appearance: She is well-developed.  Eyes:     Pupils: Pupils are equal, round, and reactive to light.  Neck:     Thyroid: No thyromegaly.     Vascular: No JVD.  Cardiovascular:     Rate and Rhythm: Normal rate and regular rhythm.     Heart sounds:     No gallop.  Pulmonary:     Effort: Pulmonary effort is normal. No respiratory distress.     Breath sounds: Normal breath sounds. No wheezing or rales.  Musculoskeletal:     Cervical back: Neck supple.     Right lower leg: No edema.     Left lower leg: No edema.  Neurological:     Mental Status: She is alert.      No results found for any visits on 10/16/21.    The 10-year ASCVD risk score (Arnett DK, et al., 2019) is: 7.9%    Assessment & Plan:   Problem List Items Addressed This Visit  Unprioritized   Hypertension   Other Visit Diagnoses     Hypokalemia    -  Primary   Relevant Orders   Basic metabolic panel     Blood pressure well controlled by today's reading and several home readings.  History of hypokalemia.  Recheck basic metabolic panel.  Flu vaccine given.  No follow-ups on file.    Carolann Littler, MD

## 2021-10-23 ENCOUNTER — Other Ambulatory Visit (HOSPITAL_COMMUNITY): Payer: Self-pay

## 2021-11-04 ENCOUNTER — Other Ambulatory Visit (HOSPITAL_COMMUNITY): Payer: Self-pay

## 2021-12-11 ENCOUNTER — Other Ambulatory Visit: Payer: Self-pay | Admitting: Family Medicine

## 2021-12-13 ENCOUNTER — Other Ambulatory Visit (HOSPITAL_COMMUNITY): Payer: Self-pay

## 2021-12-13 MED ORDER — POTASSIUM CHLORIDE ER 10 MEQ PO TBCR
20.0000 meq | EXTENDED_RELEASE_TABLET | Freq: Every day | ORAL | 0 refills | Status: DC
  Filled 2021-12-13: qty 180, 90d supply, fill #0

## 2021-12-14 ENCOUNTER — Other Ambulatory Visit (HOSPITAL_COMMUNITY): Payer: Self-pay

## 2022-01-11 ENCOUNTER — Other Ambulatory Visit (HOSPITAL_COMMUNITY): Payer: Self-pay

## 2022-01-11 ENCOUNTER — Other Ambulatory Visit: Payer: Self-pay

## 2022-01-12 ENCOUNTER — Other Ambulatory Visit (HOSPITAL_COMMUNITY): Payer: Self-pay

## 2022-01-12 ENCOUNTER — Other Ambulatory Visit: Payer: Self-pay

## 2022-01-26 ENCOUNTER — Other Ambulatory Visit (HOSPITAL_COMMUNITY): Payer: Self-pay

## 2022-01-27 ENCOUNTER — Other Ambulatory Visit (HOSPITAL_COMMUNITY): Payer: Self-pay

## 2022-02-22 ENCOUNTER — Other Ambulatory Visit: Payer: Self-pay | Admitting: Family Medicine

## 2022-02-22 DIAGNOSIS — Z1231 Encounter for screening mammogram for malignant neoplasm of breast: Secondary | ICD-10-CM

## 2022-03-09 ENCOUNTER — Other Ambulatory Visit (HOSPITAL_COMMUNITY): Payer: Self-pay

## 2022-03-09 ENCOUNTER — Other Ambulatory Visit: Payer: Self-pay | Admitting: Family Medicine

## 2022-03-09 MED ORDER — POTASSIUM CHLORIDE ER 10 MEQ PO TBCR
20.0000 meq | EXTENDED_RELEASE_TABLET | Freq: Every day | ORAL | 0 refills | Status: DC
  Filled 2022-03-09 – 2022-03-13 (×2): qty 180, 90d supply, fill #0

## 2022-03-10 ENCOUNTER — Other Ambulatory Visit (HOSPITAL_COMMUNITY): Payer: Self-pay

## 2022-03-12 ENCOUNTER — Other Ambulatory Visit (HOSPITAL_COMMUNITY): Payer: Self-pay

## 2022-03-12 ENCOUNTER — Other Ambulatory Visit: Payer: Self-pay

## 2022-03-13 ENCOUNTER — Other Ambulatory Visit (HOSPITAL_COMMUNITY): Payer: Self-pay

## 2022-03-13 ENCOUNTER — Ambulatory Visit
Admission: RE | Admit: 2022-03-13 | Discharge: 2022-03-13 | Disposition: A | Discharge status: Home / Self Care | Payer: Medicare Other | Source: Ambulatory Visit | Attending: Family Medicine | Admitting: Family Medicine

## 2022-03-13 DIAGNOSIS — Z1231 Encounter for screening mammogram for malignant neoplasm of breast: Secondary | ICD-10-CM

## 2022-03-23 ENCOUNTER — Other Ambulatory Visit (HOSPITAL_COMMUNITY): Payer: Self-pay

## 2022-03-23 ENCOUNTER — Ambulatory Visit (INDEPENDENT_AMBULATORY_CARE_PROVIDER_SITE_OTHER): Payer: Medicare Other | Admitting: Family Medicine

## 2022-03-23 ENCOUNTER — Encounter: Payer: Self-pay | Admitting: Family Medicine

## 2022-03-23 VITALS — BP 114/74 | HR 80 | Temp 97.9°F | Ht 60.0 in | Wt 132.2 lb

## 2022-03-23 DIAGNOSIS — J020 Streptococcal pharyngitis: Secondary | ICD-10-CM | POA: Diagnosis not present

## 2022-03-23 DIAGNOSIS — R059 Cough, unspecified: Secondary | ICD-10-CM

## 2022-03-23 DIAGNOSIS — J029 Acute pharyngitis, unspecified: Secondary | ICD-10-CM

## 2022-03-23 LAB — POC COVID19 BINAXNOW: SARS Coronavirus 2 Ag: NEGATIVE

## 2022-03-23 LAB — POCT RAPID STREP A (OFFICE): Rapid Strep A Screen: POSITIVE — AB

## 2022-03-23 LAB — POCT INFLUENZA A/B
Influenza A, POC: NEGATIVE
Influenza B, POC: NEGATIVE

## 2022-03-23 MED ORDER — AMOXICILLIN 500 MG PO CAPS
500.0000 mg | ORAL_CAPSULE | Freq: Two times a day (BID) | ORAL | 0 refills | Status: AC
  Filled 2022-03-23: qty 20, 10d supply, fill #0

## 2022-03-23 NOTE — Progress Notes (Signed)
Established Patient Office Visit  Subjective   Patient ID: Brianna Jackson, female    DOB: [birth year]  Age: 67 y.o. MRN: [id number]  Chief Complaint  Patient presents with   Sore Throat    Patient complains of sore throat, x3 days   Nasal Congestion    Patient complains of nasal congestion, x3 days     HPI   Brianna Jackson is seen with sore throat which started a few days ago.  She has had some mild nasal congestion.  She has some initial sneezing.  No cough.  She denies any fever or significant chills this week.  No significant body aches.  She is around grandchildren frequently but not aware of any obvious sick contacts.  She states her throat feels much like she did when she had strep throat as a child but she did have some associated nasal congestion and intermittent laryngitis this week which was different.  No penicillin or other antibiotic allergy  Past Medical History:  Diagnosis Date   Allergy    seasonal   Chicken pox    GERD (gastroesophageal reflux disease)    not diagnosed   Heart murmur    VSD, ECHO 2010   History of MRSA infection 2010, 2012   Hyperlipidemia    Hypertension    Positive TB test    treated with INH childhood   Thyroid disease    hypothyroid   Urine incontinence    UTI (urinary tract infection)    Past Surgical History:  Procedure Laterality Date   COLONOSCOPY  01/30/2004   COLONOSCOPY  11/2014   SA SSP   MOLE REMOVAL  01/29/2006   MRSA  2010, 2012   absess   POLYPECTOMY     STRABISMUS SURGERY  01/29/1962   TONSILLECTOMY      reports that she has never smoked. She has never used smokeless tobacco. She reports current alcohol use. She reports that she does not use drugs. family history includes Alcohol abuse in her father; Cancer in her mother; Cirrhosis in her father. Allergies  Allergen Reactions   Other Hives    Nuts    Wheat     Gluten intollerance    Review of Systems  Constitutional:  Negative for chills and fever.  HENT:   Positive for congestion and sore throat.   Respiratory:  Negative for cough.       Objective:     BP 114/74 (BP Location: Left Arm, Patient Position: Sitting, Cuff Size: Normal)   Pulse 80   Temp 97.9 F (36.6 C) (Oral)   Ht 5' (1.524 m)   Wt 132 lb 3.2 oz (60 kg)   SpO2 98%   BMI 25.82 kg/m    Physical Exam Vitals reviewed.  Constitutional:      General: She is not in acute distress.    Appearance: She is well-developed. She is not ill-appearing or toxic-appearing.  HENT:     Right Ear: Tympanic membrane normal.     Left Ear: Tympanic membrane normal.     Mouth/Throat:     Mouth: Mucous membranes are moist.     Pharynx: Oropharynx is clear.     Comments: Only mild erythema with no exudate Cardiovascular:     Rate and Rhythm: Normal rate and regular rhythm.  Pulmonary:     Effort: Pulmonary effort is normal.     Breath sounds: Normal breath sounds.  Musculoskeletal:     Cervical back: Neck supple.  Lymphadenopathy:  Cervical: No cervical adenopathy.  Neurological:     Mental Status: She is alert.      Results for orders placed or performed in visit on 03/23/22  POC COVID-19  Result Value Ref Range   SARS Coronavirus 2 Ag Negative Negative  POC Influenza A/B  Result Value Ref Range   Influenza A, POC Negative Negative   Influenza B, POC Negative Negative  POCT rapid strep A  Result Value Ref Range   Rapid Strep A Screen Positive (A) Negative      The 10-year ASCVD risk score (Arnett DK, et al., 2019) is: 7.4%    Assessment & Plan:   Problem List Items Addressed This Visit   None Visit Diagnoses     Sore throat    -  Primary   Relevant Orders   POCT rapid strep A (Completed)   Cough, unspecified type       Relevant Orders   POC COVID-19 (Completed)   POC Influenza A/B (Completed)     Patient presents with 3-day history of sore throat.  Rapid strep came back positive.  Influenza and COVID-negative.  Start amoxicillin 500 mg twice daily  for 10 days.  Over-the-counter analgesics as needed.  Follow-up for any persistent or worsening symptoms  No follow-ups on file.    Carolann Littler, MD

## 2022-04-20 ENCOUNTER — Other Ambulatory Visit: Payer: Self-pay | Admitting: Family Medicine

## 2022-04-20 ENCOUNTER — Other Ambulatory Visit: Payer: Self-pay

## 2022-04-20 ENCOUNTER — Other Ambulatory Visit (HOSPITAL_COMMUNITY): Payer: Self-pay

## 2022-04-20 MED ORDER — LISINOPRIL 20 MG PO TABS
ORAL_TABLET | Freq: Every day | ORAL | 1 refills | Status: DC
  Filled 2022-04-20: qty 90, 90d supply, fill #0

## 2022-04-20 MED ORDER — LEVOTHYROXINE SODIUM 100 MCG PO TABS
100.0000 ug | ORAL_TABLET | Freq: Every day | ORAL | 1 refills | Status: DC
  Filled 2022-04-20: qty 90, 90d supply, fill #0
  Filled 2022-07-15: qty 90, 90d supply, fill #1

## 2022-04-20 MED ORDER — LISINOPRIL 20 MG PO TABS
20.0000 mg | ORAL_TABLET | Freq: Every day | ORAL | 1 refills | Status: DC
  Filled 2022-04-20: qty 90, 90d supply, fill #0
  Filled 2022-08-13: qty 90, 90d supply, fill #1

## 2022-04-20 MED ORDER — LEVOTHYROXINE SODIUM 100 MCG PO TABS
ORAL_TABLET | Freq: Every day | ORAL | 1 refills | Status: DC
  Filled 2022-04-20: qty 90, 90d supply, fill #0

## 2022-04-23 ENCOUNTER — Other Ambulatory Visit (HOSPITAL_COMMUNITY): Payer: Self-pay

## 2022-04-23 ENCOUNTER — Other Ambulatory Visit: Payer: Self-pay

## 2022-05-07 ENCOUNTER — Encounter: Payer: Medicare Other | Admitting: Family Medicine

## 2022-05-08 ENCOUNTER — Ambulatory Visit (INDEPENDENT_AMBULATORY_CARE_PROVIDER_SITE_OTHER): Payer: Medicare Other | Admitting: Family

## 2022-05-08 VITALS — BP 100/60 | HR 73 | Temp 97.9°F | Ht 59.45 in | Wt 133.2 lb

## 2022-05-08 DIAGNOSIS — Z23 Encounter for immunization: Secondary | ICD-10-CM | POA: Diagnosis not present

## 2022-05-08 DIAGNOSIS — E038 Other specified hypothyroidism: Secondary | ICD-10-CM

## 2022-05-08 DIAGNOSIS — I10 Essential (primary) hypertension: Secondary | ICD-10-CM | POA: Diagnosis not present

## 2022-05-08 DIAGNOSIS — E876 Hypokalemia: Secondary | ICD-10-CM

## 2022-05-08 DIAGNOSIS — R6889 Other general symptoms and signs: Secondary | ICD-10-CM

## 2022-05-08 DIAGNOSIS — Z0000 Encounter for general adult medical examination without abnormal findings: Secondary | ICD-10-CM | POA: Diagnosis not present

## 2022-05-08 LAB — TSH: TSH: 1.15 u[IU]/mL (ref 0.35–5.50)

## 2022-05-08 LAB — COMPREHENSIVE METABOLIC PANEL
ALT: 15 U/L (ref 0–35)
AST: 23 U/L (ref 0–37)
Albumin: 4.3 g/dL (ref 3.5–5.2)
Alkaline Phosphatase: 60 U/L (ref 39–117)
BUN: 14 mg/dL (ref 6–23)
CO2: 31 mEq/L (ref 19–32)
Calcium: 10.1 mg/dL (ref 8.4–10.5)
Chloride: 96 mEq/L (ref 96–112)
Creatinine, Ser: 1.04 mg/dL (ref 0.40–1.20)
GFR: 55.43 mL/min — ABNORMAL LOW (ref >60.00)
Glucose, Bld: 94 mg/dL (ref 70–99)
Potassium: 3.6 mEq/L (ref 3.5–5.1)
Sodium: 135 mEq/L (ref 135–145)
Total Bilirubin: 0.5 mg/dL (ref 0.2–1.2)
Total Protein: 6.9 g/dL (ref 6.0–8.3)

## 2022-05-08 LAB — CBC WITH DIFFERENTIAL/PLATELET
Basophils Absolute: 0.1 10*3/uL (ref 0.0–0.1)
Basophils Relative: 1.1 % (ref 0.0–3.0)
Eosinophils Absolute: 0.2 10*3/uL (ref 0.0–0.7)
Eosinophils Relative: 2.8 % (ref 0.0–5.0)
HCT: 39.3 % (ref 36.0–46.0)
Hemoglobin: 13.6 g/dL (ref 12.0–15.0)
Lymphocytes Relative: 24.9 % (ref 12.0–46.0)
Lymphs Abs: 1.6 10*3/uL (ref 0.7–4.0)
MCHC: 34.5 g/dL (ref 30.0–36.0)
MCV: 84.7 fl (ref 78.0–100.0)
Monocytes Absolute: 0.6 10*3/uL (ref 0.1–1.0)
Monocytes Relative: 8.5 % (ref 3.0–12.0)
Neutro Abs: 4.1 10*3/uL (ref 1.4–7.7)
Neutrophils Relative %: 62.7 % (ref 43.0–77.0)
Platelets: 341 10*3/uL (ref 150.0–400.0)
RBC: 4.64 Mil/uL (ref 3.87–5.11)
RDW: 13.6 % (ref 11.5–15.5)
WBC: 6.5 10*3/uL (ref 4.0–10.5)

## 2022-05-08 LAB — LIPID PANEL
Cholesterol: 194 mg/dL (ref 0–200)
HDL: 53.7 mg/dL (ref >39.00)
LDL Cholesterol: 118 mg/dL — ABNORMAL HIGH (ref 0–99)
NonHDL: 140.43
Total CHOL/HDL Ratio: 4
Triglycerides: 111 mg/dL (ref 0.0–149.0)
VLDL: 22.2 mg/dL (ref 0.0–40.0)

## 2022-05-08 NOTE — Progress Notes (Signed)
Complete physical exam  Patient: Brianna Jackson   DOB: 10-Apr-1955   67 y.o. Female  MRN: [id number]  Subjective:    No chief complaint on file.   Brianna Jackson is a 67 y.o. female who presents today for a complete physical exam. She reports consuming a low fat diet.  She generally feels well.  Reports walking 5 days a week.  She reports sleeping well. She does not have additional problems to discuss today.  She has a history of hypothyroidism, hypertension and reports doing well overall.  She has had a mammogram performed February 2024 and colonoscopy in June 2022.  Tdap needs to be updated today.  She reports picking her grandchildren up from school every day and eating dinner with her daughter and son-in-law.  Overall is happy.   Most recent fall risk assessment:    06/19/2021    9:23 AM  Fall Risk   Falls in the past year? 1  Number falls in past yr: 0  Injury with Fall? 1  Risk for fall due to : No Fall Risks  Follow up Falls evaluation completed     Most recent depression screenings:    03/23/2022    4:44 PM 05/26/2021    8:25 AM  PHQ 2/9 Scores  PHQ - 2 Score 0 0        Patient Care Team: Kristian Covey, MD as PCP - General (Family Medicine)   Outpatient Medications Prior to Visit  Medication Sig  . albuterol (PROAIR HFA) 108 (90 Base) MCG/ACT inhaler Inhale 1-2 puffs into the lungs every 4 (four) hours as needed for wheezing or shortness of breath.  . Calcium Carbonate-Vitamin D (CALCIUM-VITAMIN D) 500-200 MG-UNIT per tablet Take 1 tablet by mouth daily.  . chlorthalidone (HYGROTON) 25 MG tablet Take 1 tablet by mouth daily.  Marland Kitchen levothyroxine (SYNTHROID) 100 MCG tablet Take 1 tablet (100 mcg total) by mouth daily.  Marland Kitchen lisinopril (ZESTRIL) 20 MG tablet Take 1 tablet (20 mg total) by mouth daily.  . potassium chloride (KLOR-CON 10) 10 MEQ tablet Take 2 tablets (20 mEq total) by mouth daily.  . vitamin B-12 (CYANOCOBALAMIN) 100 MCG tablet Take 1,000 mcg by mouth  daily.   No facility-administered medications prior to visit.    Review of Systems  All other systems reviewed and are negative.        Objective:     BP 100/60 (BP Location: Left Arm, Patient Position: Sitting, Cuff Size: Normal)   Pulse 73   Temp 97.9 F (36.6 C) (Oral)   Ht 4' 11.45" (1.51 m)   Wt 133 lb 3.2 oz (60.4 kg)   SpO2 98%   BMI 26.50 kg/m    Physical Exam Constitutional:      Appearance: Normal appearance. She is normal weight.  HENT:     Right Ear: Tympanic membrane and ear canal normal.     Left Ear: Tympanic membrane, ear canal and external ear normal.     Nose: Nose normal.     Mouth/Throat:     Mouth: Mucous membranes are moist.     Pharynx: Oropharynx is clear.  Eyes:     Extraocular Movements: Extraocular movements intact.     Conjunctiva/sclera: Conjunctivae normal.     Pupils: Pupils are equal, round, and reactive to light.  Cardiovascular:     Rate and Rhythm: Normal rate and regular rhythm.     Heart sounds: Murmur heard.     Comments: 4/6 SEM Abdominal:  General: Abdomen is flat. Bowel sounds are normal.     Palpations: Abdomen is soft.  Musculoskeletal:        General: Normal range of motion.     Cervical back: Normal range of motion and neck supple.  Skin:    General: Skin is warm and dry.  Neurological:     General: No focal deficit present.     Mental Status: She is alert and oriented to person, place, and time. Mental status is at baseline.  Psychiatric:        Mood and Affect: Mood normal.        Behavior: Behavior normal.     No results found for any visits on 05/08/22.     Assessment & Plan:    Routine Health Maintenance and Physical Exam  Immunization History  Administered Date(s) Administered  . Fluad Quad(high Dose 65+) 10/16/2021  . Influenza Split 09/29/2013  . Influenza, Quadrivalent, Recombinant, Inj, Pf 10/22/2018  . Influenza-Unspecified 09/30/2006, 09/30/2015, 11/04/2019, 11/22/2020  . PFIZER(Purple  Top)SARS-COV-2 Vaccination 11/20/2019  . Pneumococcal Conjugate-13 04/13/2020  . Pneumococcal Polysaccharide-23 04/26/2021  . Tdap 01/29/2009  . Zoster Recombinat (Shingrix) 02/19/2017, 05/16/2017    Health Maintenance  Topic Date Due  . DTaP/Tdap/Td (2 - Td or Tdap) 01/30/2019  . COVID-19 Vaccine (2 - Pfizer risk series) 12/11/2019  . Medicare Annual Wellness (AWV)  05/27/2022  . INFLUENZA VACCINE  08/30/2022  . MAMMOGRAM  03/13/2024  . COLONOSCOPY (Pts 45-67yrs Insurance coverage will need to be confirmed)  07/20/2025  . Pneumonia Vaccine 33+ Years old  Completed  . DEXA SCAN  Completed  . Hepatitis C Screening  Completed  . Zoster Vaccines- Shingrix  Completed  . HPV VACCINES  Aged Out    Discussed health benefits of physical activity, and encouraged her to engage in regular exercise appropriate for her age and condition.  Problem List Items Addressed This Visit     Hypothyroid   Relevant Orders   TSH   CBC w/Diff   Hypertension   Relevant Orders   CMP   CBC w/Diff   Lipid panel   Other Visit Diagnoses     Preventative health care    -  Primary   Relevant Orders   CMP   CBC w/Diff   Lipid panel   Hypokalemia       Relevant Orders   CMP   CBC w/Diff   Lipid panel     Will obtain labs today to include TSH, CBC, CMP, lipids and notify patient pending results.  Encouraged healthy diet, exercise, self breast exams.  Continue current medications.  Follow-up in 6 months and sooner as needed.  Tdap was administered today.     Eulis Foster, FNP

## 2022-05-09 ENCOUNTER — Telehealth: Payer: Self-pay | Admitting: Family Medicine

## 2022-05-09 NOTE — Telephone Encounter (Signed)
Pt is returning Brianna Jackson call concerning blood work results 

## 2022-05-09 NOTE — Telephone Encounter (Signed)
Please see result note 

## 2022-05-21 ENCOUNTER — Telehealth: Payer: Self-pay | Admitting: Family Medicine

## 2022-05-21 NOTE — Telephone Encounter (Signed)
Contacted Verl Dicker to schedule their annual wellness visit. Appointment made for 05/29/22.  Rudell Cobb AWV direct phone # [phone]  Due to schedule change moved 4/30 appt from 8:15 N[facility]2 schedule to [employer] @ 10  sent my chart message with time change

## 2022-05-29 ENCOUNTER — Ambulatory Visit (INDEPENDENT_AMBULATORY_CARE_PROVIDER_SITE_OTHER): Payer: Medicare Other | Admitting: Family Medicine

## 2022-05-29 ENCOUNTER — Encounter: Payer: Self-pay | Admitting: Family Medicine

## 2022-05-29 DIAGNOSIS — Z0000 Encounter for general adult medical examination without abnormal findings: Secondary | ICD-10-CM

## 2022-05-29 NOTE — Progress Notes (Signed)
PATIENT CHECK-IN and HEALTH RISK ASSESSMENT QUESTIONNAIRE:  -completed by phone/video for upcoming Medicare Preventive Visit  Pre-Visit Check-in: 1)Vitals (height, wt, BP, etc) - record in vitals section for visit on day of visit 2)Review and Update Medications, Allergies PMH, Surgeries, Social history in Epic 3)Hospitalizations in the last year with date/reason? none  4)Review and Update Care Team (patient's specialists) in Epic 5) Complete PHQ9 in Epic  6) Complete Fall Screening in Epic 7)Review all Health Maintenance Due and order under PCP if not done.  8)Medicare Wellness Questionnaire: Answer theses question about your habits: Brianna Jackson you drink alcohol? yes If yes, how many drinks Brianna Jackson you have a day?rarely Have you ever smoked?no Quit date if applicable?   How many packs a day Brianna Jackson/did you smoke? N/A Brianna Jackson you use smokeless tobacco?no Brianna Jackson you use an illicit drugs?no Brianna Jackson you exercises? Yes IF so, what type and how many days/minutes per week?walking-2-3 times per week - 30 minutes Are you sexually active? No Number of partners? Working on her diet the last few years since retired, has lost 25-30 lbs with diet change  Typical breakfast-oatmeal, breakfast bar Typical lunch-yogurt Typical dinner-variety of meals at her daughter's house Typical snacks: Pretzels Beverages: water, milk, occasionally Starbucks  Answer theses question about you: Can you perform most household chores?yes Brianna Jackson you find it hard to follow a conversation in a noisy room?no Brianna Jackson you often ask people to speak up or repeat themselves?no Brianna Jackson you feel that you have a problem with memory?yes-sometimes will forget names Brianna Jackson you balance your checkbook and or bank acounts?yes Brianna Jackson you feel safe at home?yes Last dentist visit?last week Brianna Jackson you need assistance with any of the following: Please note if so - none  Driving?no   Feeding yourself? no  Getting from bed to chair? no  Getting to the toilet? no  Bathing or showering?  no  Dressing yourself? no  Managing money?no  Climbing a flight of stairs-no  Preparing meals? no  Brianna Jackson you have Advanced Directives in place (Living Will, Healthcare Power or [place])? yes   Last eye Exam and location? Dr Gerline Legacy with cataracts-January 2024   Brianna Jackson you currently use prescribed or non-prescribed narcotic or opioid pain medications?no  Brianna Jackson you have a history or close family history of breast, ovarian, tubal or peritoneal cancer or a family member with BRCA (breast cancer susceptibility 1 and 2) gene mutations? no  Nurse/Assistant Credentials/time stamp: Mellody Drown   ----------------------------------------------------------------------------------------------------------------------------------------------------------------------------------------------------------------------   MEDICARE ANNUAL PREVENTIVE VISIT WITH PROVIDER: (Welcome to [facility], initial annual wellness or annual wellness exam)  Virtual Visit via Video Note  I connected with Brianna Jackson on 05/29/22 by a video enabled telemedicine application and verified that I am speaking with the correct person using two identifiers.  Location patient: home Location provider:work or home office Persons participating in the virtual visit: patient, provider  Concerns and/or follow up today: no concerns   See HM section in Epic for other details of completed HM.    ROS: negative for report of fevers, unintentional weight loss, vision changes, vision loss, hearing loss or change, chest pain, sob, hemoptysis, melena, hematochezia, hematuria, falls, bleeding or bruising, thoughts of suicide or self harm, memory loss  Patient-completed extensive health risk assessment - reviewed and discussed with the patient: See Health Risk Assessment completed with patient prior to the visit either above or in recent phone note. This was reviewed in detailed with the patient today and appropriate  recommendations, orders and referrals were placed as  needed per Summary below and patient instructions.   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   Thyroid disease    hypothyroid   Urine incontinence    UTI (urinary tract infection)     Past Surgical History:  Procedure Laterality Date   COLONOSCOPY  01/30/2004   COLONOSCOPY  11/2014   SA SSP   MOLE REMOVAL  01/29/2006   MRSA  2010, 2012   absess   POLYPECTOMY     STRABISMUS SURGERY  01/29/1962   TONSILLECTOMY      Social History   Socioeconomic History   Marital status: Divorced    Spouse name: Not on file   Number of children: Not on file   Years of education: Not on file   Highest education level: Master's degree (e.g., MA, MS, MEng, MEd, MSW, MBA)  Occupational History   Not on file  Tobacco Use   Smoking status: Never   Smokeless tobacco: Never  Vaping Use   Vaping Use: Never used  Substance and Sexual Activity   Alcohol use: Yes    Alcohol/week: 0.0 standard drinks of alcohol    Comment: rare socially   Drug use: No   Sexual activity: Yes  Other Topics Concern   Not on file  Social History Narrative   Not on file   Social Determinants of Health   Financial Resource Strain: Low Risk  (06/18/2021)   Overall Financial Resource Strain (CARDIA)    Difficulty of Paying Living Expenses: Not hard at all  Food Insecurity: No Food Insecurity (06/18/2021)   Hunger Vital Sign    Worried About Running Out of Food in the Last Year: Never true    Ran  Out of Food in the Last Year: Never true  Transportation Needs: No Transportation Needs (06/18/2021)   PRAPARE - Administrator, Civil Service (Medical): No    Lack of Transportation (Non-Medical): No  Physical Activity: Insufficiently Active (06/18/2021)   Exercise Vital Sign    Days of Exercise per Week: 3 days    Minutes of Exercise per Session: 20 min  Stress: No Stress Concern Present (06/18/2021)   [employer] of Occupational Health - Occupational Stress Questionnaire    Feeling of Stress : Not at all  Social Connections: Moderately Integrated (06/18/2021)   Social Connection and Isolation Panel [NHANES]    Frequency of Communication with Friends and Family: More than three times a week    Frequency of Social Gatherings with Friends and Family: More than three times a week    Attends Religious Services: More than 4 times per year    Active Member of [employer] or Organizations: Yes    Attends Banker Meetings: 1 to 4 times per year    Marital Status: Divorced  Intimate Partner Violence: Not At Risk (05/26/2021)   Humiliation, Afraid, Rape, and Kick questionnaire    Fear of Current or Ex-Partner: No  Emotionally Abused: No    Physically Abused: No    Sexually Abused: No    Family History  Problem Relation Age of Onset   Cancer Mother        lung cancer-never found primary sight    Alcohol abuse Father    Cirrhosis Father    Colon cancer Neg Hx    Colon polyps Neg Hx    Rectal cancer Neg Hx    Stomach cancer Neg Hx    Esophageal cancer Neg Hx    Breast cancer Neg Hx     Current Outpatient Medications on File Prior to Visit  Medication Sig Dispense Refill   albuterol (PROAIR HFA) 108 (90 Base) MCG/ACT inhaler Inhale 1-2 puffs into the lungs every 4 (four) hours as needed for wheezing or shortness of breath. 1 Inhaler 2   Calcium Carbonate-Vitamin D (CALCIUM-VITAMIN D) 500-200 MG-UNIT per tablet Take 1 tablet by mouth daily.     chlorthalidone  (HYGROTON) 25 MG tablet Take 1 tablet by mouth daily. 90 tablet 3   levothyroxine (SYNTHROID) 100 MCG tablet Take 1 tablet (100 mcg total) by mouth daily. 90 tablet 1   lisinopril (ZESTRIL) 20 MG tablet Take 1 tablet (20 mg total) by mouth daily. 90 tablet 1   potassium chloride (KLOR-CON 10) 10 MEQ tablet Take 2 tablets (20 mEq total) by mouth daily. 180 tablet 0   vitamin B-12 (CYANOCOBALAMIN) 100 MCG tablet Take 1,000 mcg by mouth daily.     [DISCONTINUED] metoprolol tartrate (LOPRESSOR) 100 MG tablet Take 1 tablet by mouth 2 hours prior to Cardiac CT 1 tablet 0   No current facility-administered medications on file prior to visit.    Allergies  Allergen Reactions   Other Hives    Nuts    Wheat     Gluten intollerance       Physical Exam There were no vitals filed for this visit. Estimated body mass index is 26.5 kg/m as calculated from the following:   Height as of 05/08/22: 4' 11.45" (1.51 m).   Weight as of 05/08/22: 133 lb 3.2 oz (60.4 kg).  EKG (optional): deferred due to virtual visit  GENERAL: alert, oriented, no acute distress detected, full vision exam deferred due to pandemic and/or virtual encounter  HEENT: atraumatic, conjunttiva clear, no obvious abnormalities on inspection of external nose and ears  NECK: normal movements of the head and neck  LUNGS: on inspection no signs of respiratory distress, breathing rate appears normal, no obvious gross SOB, gasping or wheezing  CV: no obvious cyanosis  MS: moves all visible extremities without noticeable abnormality  PSYCH/NEURO: pleasant and cooperative, no obvious depression or anxiety, speech and thought processing grossly intact, Cognitive function grossly intact  Flowsheet Row Office Visit from [facility]/29/2022 in [facility]  PHQ-9 Total Score 0           05/29/2022    9:08 AM 03/23/2022    4:44 PM 05/26/2021    8:25 AM 05/19/2021   11:50 AM 04/13/2020   10:04 AM  Depression  screen PHQ 2/9  Decreased Interest 0 0 0 0 0  Down, Depressed, Hopeless 0 0 0 0 0  PHQ - 2 Score 0 0 0 0 0  Altered sleeping 0      Tired, decreased energy 0      Change in appetite 0      Feeling bad or failure about yourself  0      Trouble concentrating 0  Moving slowly or fidgety/restless 0      Suicidal thoughts 0      PHQ-9 Score 0           05/26/2021    8:28 AM 06/18/2021    9:39 PM 06/19/2021    9:23 AM 05/28/2022    1:28 PM 05/29/2022    9:07 AM  Fall Risk  Falls in the past year? 1 1 1  0 0  Was there an injury with Fall? 0 1 1 0 0  Was there an injury with Fall? - Comments No injuries or medical attention needed      Fall Risk Category Calculator 1 2 2  0 0  Fall Risk Category (Retired) Low Moderate Moderate    (RETIRED) Patient Fall Risk Level Low fall risk  Low fall risk    Patient at Risk for Falls Due to No Fall Risks  No Fall Risks  History of fall(s)  Fall risk Follow up   Falls evaluation completed  Falls evaluation completed     SUMMARY AND PLAN:  Encounter for Medicare annual wellness exam   Discussed applicable health maintenance/preventive health measures and advised and referred or ordered per patient preferences: -discussed covid19 vaccine recommendations  -discussed bone density - she has declined repeat for now, discussed bone building diet, exercise, supplements and fall prevention Health Maintenance  Topic Date Due   COVID-19 Vaccine (5 - 2023-24 season) 05/29/2023 (Originally 01/12/2022)   INFLUENZA VACCINE  08/30/2022   Medicare Annual Wellness (AWV)  05/29/2023   MAMMOGRAM  03/13/2024   COLONOSCOPY (Pts 45-54yrs Insurance coverage will need to be confirmed)  07/20/2025   DTaP/Tdap/Td (3 - Td or Tdap) 05/07/2032   Pneumonia Vaccine 74+ Years old  Completed   DEXA SCAN  Completed   Hepatitis C Screening  Completed   Zoster Vaccines- Shingrix  Completed   HPV VACCINES  Aged [employer] and counseling on the following was provided  based on the above review of health and a plan/checklist for the patient, along with additional information discussed, was provided for the patient in the patient instructions :  -Provided counseling and demonstrated safe balance exercises that can be done at home to improve balance and discussed exercise guidelines for adults with include balance exercises at least 3 days per week.  -Advised and counseled on a healthy lifestyle - including the importance of a healthy diet, regular physical activity, social connections and stress management. -Reviewed patient's current diet. Advised and counseled on a whole foods based healthy diet. A summary of a healthy diet was provided in the Patient Instructions. She could benefit from increased intake of vegetable servings per day and degrease in processed foods. Discussed ways to economically and efficiently add veggies to diet.  -reviewed patient's current physical activity level and discussed exercise guidelines for adults. Discussed community resources and ideas for safe exercise at home to assist in meeting exercise guideline recommendations in a safe and healthy way. Advised addition of bone building, muscle strengthening and balance exercises.  -Advise yearly dental visits at minimum and regular eye exams -Advised and counseled on alcohol safe limits, risks  Follow up: see patient instructions     Patient Instructions  I really enjoyed getting to talk with you today! I am available on Tuesdays and Thursdays for virtual visits if you have any questions or concerns, or if I can be of any further assistance.   CHECKLIST FROM ANNUAL WELLNESS VISIT:  -Follow up (please call to schedule if  not scheduled after visit):   -yearly for annual wellness visit with primary care office  Here is a list of your preventive care/health maintenance measures and the plan for each if any are due:  PLAN For any measures below that may be due:   Health Maintenance   Topic Date Due   COVID-19 Vaccine (5 - 2023-24 season) 05/29/2023 (Originally 01/12/2022)   INFLUENZA VACCINE  08/30/2022   Medicare Annual Wellness (AWV)  05/29/2023   MAMMOGRAM  03/13/2024   COLONOSCOPY (Pts 45-42yrs Insurance coverage will need to be confirmed)  07/20/2025   DTaP/Tdap/Td (3 - Td or Tdap) 05/07/2032   Pneumonia Vaccine 29+ Years old  Completed   DEXA SCAN  Completed   Hepatitis C Screening  Completed   Zoster Vaccines- Shingrix  Completed   HPV VACCINES  Aged Out  Please let us know if you decide you wish to Brianna Jackson the bone density test  -See a dentist at least yearly  -Get your eyes checked and then per your eye specialist's recommendations  -Other issues addressed today:   -I have included below further information regarding a healthy whole foods based diet, physical activity guidelines for adults, stress management and opportunities for social connections. I hope you find this information useful.   -----------------------------------------------------------------------------------------------------------------------------------------------------------------------------------------------------------------------------------------------------------  NUTRITION: -eat real food: lots of colorful vegetables (half the plate) and fruits -5-7 servings of vegetables and fruits per day (fresh or steamed is best), exp. 2 servings of vegetables with lunch and dinner and 2 servings of fruit per day. Berries and greens such as kale and collards are great choices.  -consume on a regular basis: whole grains (make sure first ingredient on label contains the word "whole"), fresh fruits, fish, nuts, seeds, healthy oils (such as olive oil, avocado oil, grape seed oil) -may eat small amounts of dairy and lean meat on occasion, but avoid processed meats such as ham, bacon, lunch meat, etc. -drink water -try to avoid fast food and pre-packaged foods, processed meat -most experts advise  limiting sodium to < 2300mg  per day, should limit further is any chronic conditions such as high blood pressure, heart disease, diabetes, etc. The American Heart Association advised that < 1500mg  is is ideal -try to avoid foods that contain any ingredients with names you Brianna Jackson not recognize  -try to avoid sugar/sweets (except for the natural sugar that occurs in fresh fruit) -try to avoid sweet drinks -try to avoid white rice, white bread, pasta (unless whole grain), white or yellow potatoes  EXERCISE GUIDELINES FOR ADULTS: -if you wish to increase your physical activity, Brianna Jackson so gradually and with the approval of your doctor -STOP and seek medical care immediately if you have any chest pain, chest discomfort or trouble breathing when starting or increasing exercise  -move and stretch your body, legs, feet and arms when sitting for long periods -Physical activity guidelines for optimal health in adults: -least 150 minutes per week of aerobic exercise (can talk, but not sing) once approved by your doctor, 20-30 minutes of sustained activity or two 10 minute episodes of sustained activity every day.  -resistance training at least 2 days per week if approved by your doctor -balance exercises 3+ days per week:   Stand somewhere where you have something sturdy to hold onto if you lose balance.    1) lift up on toes, start with 5x per day and work up to 20x   2) stand and lift on leg straight out to the side so that foot  is a few inches of the floor, start with 5x each side and work up to 20x each side   3) stand on one foot, start with 5 seconds each side and work up to 20 seconds on each side  If you need ideas or help with getting more active:  -Silver sneakers https://tools.silversneakers.com  -Walk with a Doc: http://stephens-thompson.biz/  -try to include resistance (weight lifting/strength building) and balance exercises twice per week: or the following link for  ideas: ChessContest.fr  UpdateClothing.com.cy  STRESS MANAGEMENT: -can try meditating, or just sitting quietly with deep breathing while intentionally relaxing all parts of your body for 5 minutes daily -if you need further help with stress, anxiety or depression please follow up with your primary doctor or contact the wonderful folks at [facility]: [place]: -options in [place] if you wish to engage in more social and exercise related activities:  -Silver sneakers https://tools.silversneakers.com  -Walk with a Doc: http://stephens-thompson.biz/  -Check out the [place] 50+ section on the [place] [employer] (hiking clubs, book clubs, cards and games, chess, exercise classes, aquatic classes and much more) - see the website for details: https://www.[place].gov/departments/parks-recreation/active-adults50  -YouTube has lots of exercise videos for different ages and abilities as well  -[place] (a variety of indoor and outdoor inperson activities for adults). [phone]. [address]  -Virtual Online Classes (a variety of topics): see seniorplanet.org or call [phone]  -consider volunteering at a school, hospice center, church, senior center or elsewhere           Lucretia Kern, Brianna Jackson

## 2022-05-29 NOTE — Patient Instructions (Signed)
I really enjoyed getting to talk with you today! I am available on Tuesdays and Thursdays for virtual visits if you have any questions or concerns, or if I can be of any further assistance.   CHECKLIST FROM ANNUAL WELLNESS VISIT:  -Follow up (please call to schedule if not scheduled after visit):   -yearly for annual wellness visit with primary care office  Here is a list of your preventive care/health maintenance measures and the plan for each if any are due:  PLAN For any measures below that may be due:   Health Maintenance  Topic Date Due   COVID-19 Vaccine (5 - 2023-24 season) 05/29/2023 (Originally 01/12/2022)   INFLUENZA VACCINE  08/30/2022   Medicare Annual Wellness (AWV)  05/29/2023   MAMMOGRAM  03/13/2024   COLONOSCOPY (Pts 45-87yrs Insurance coverage will need to be confirmed)  07/20/2025   DTaP/Tdap/Td (3 - Td or Tdap) 05/07/2032   Pneumonia Vaccine 3+ Years old  Completed   DEXA SCAN  Completed   Hepatitis C Screening  Completed   Zoster Vaccines- Shingrix  Completed   HPV VACCINES  Aged Out  Please let us know if you decide you wish to do the bone density test  -See a dentist at least yearly  -Get your eyes checked and then per your eye specialist's recommendations  -Other issues addressed today:   -I have included below further information regarding a healthy whole foods based diet, physical activity guidelines for adults, stress management and opportunities for social connections. I hope you find this information useful.   -----------------------------------------------------------------------------------------------------------------------------------------------------------------------------------------------------------------------------------------------------------  NUTRITION: -eat real food: lots of colorful vegetables (half the plate) and fruits -5-7 servings of vegetables and fruits per day (fresh or steamed is best), exp. 2 servings of vegetables with  lunch and dinner and 2 servings of fruit per day. Berries and greens such as kale and collards are great choices.  -consume on a regular basis: whole grains (make sure first ingredient on label contains the word "whole"), fresh fruits, fish, nuts, seeds, healthy oils (such as olive oil, avocado oil, grape seed oil) -may eat small amounts of dairy and lean meat on occasion, but avoid processed meats such as ham, bacon, lunch meat, etc. -drink water -try to avoid fast food and pre-packaged foods, processed meat -most experts advise limiting sodium to < 2300mg  per day, should limit further is any chronic conditions such as high blood pressure, heart disease, diabetes, etc. The American Heart Association advised that < 1500mg  is is ideal -try to avoid foods that contain any ingredients with names you do not recognize  -try to avoid sugar/sweets (except for the natural sugar that occurs in fresh fruit) -try to avoid sweet drinks -try to avoid white rice, white bread, pasta (unless whole grain), white or yellow potatoes  EXERCISE GUIDELINES FOR ADULTS: -if you wish to increase your physical activity, do so gradually and with the approval of your doctor -STOP and seek medical care immediately if you have any chest pain, chest discomfort or trouble breathing when starting or increasing exercise  -move and stretch your body, legs, feet and arms when sitting for long periods -Physical activity guidelines for optimal health in adults: -least 150 minutes per week of aerobic exercise (can talk, but not sing) once approved by your doctor, 20-30 minutes of sustained activity or two 10 minute episodes of sustained activity every day.  -resistance training at least 2 days per week if approved by your doctor -balance exercises 3+ days per week:  Stand somewhere where you have something sturdy to hold onto if you lose balance.    1) lift up on toes, start with 5x per day and work up to 20x   2) stand and lift on  leg straight out to the side so that foot is a few inches of the floor, start with 5x each side and work up to 20x each side   3) stand on one foot, start with 5 seconds each side and work up to 20 seconds on each side  If you need ideas or help with getting more active:  -Silver sneakers https://tools.silversneakers.com  -Walk with a Doc: http://stephens-thompson.biz/  -try to include resistance (weight lifting/strength building) and balance exercises twice per week: or the following link for ideas: ChessContest.fr  UpdateClothing.com.cy  STRESS MANAGEMENT: -can try meditating, or just sitting quietly with deep breathing while intentionally relaxing all parts of your body for 5 minutes daily -if you need further help with stress, anxiety or depression please follow up with your primary doctor or contact the wonderful folks at [place]: [place]: -options in [place] if you wish to engage in more social and exercise related activities:  -Silver sneakers https://tools.silversneakers.com  -Walk with a Doc: http://stephens-thompson.biz/  -Check out the [place] 50+ section on the [employer] (hiking clubs, book clubs, cards and games, chess, exercise classes, aquatic classes and much more) - see the website for details: https://www.[place].gov/departments/parks-recreation/active-adults50  -YouTube has lots of exercise videos for different ages and abilities as well  -[place] (a variety of indoor and outdoor inperson activities for adults). [phone]. [address].  -Virtual Online Classes (a variety of topics): see seniorplanet.org or call 307-167-8626  -consider volunteering at a school, hospice center, church, senior center or elsewhere

## 2022-06-05 ENCOUNTER — Other Ambulatory Visit: Payer: Self-pay

## 2022-06-05 ENCOUNTER — Other Ambulatory Visit: Payer: Self-pay | Admitting: Family Medicine

## 2022-06-05 MED ORDER — POTASSIUM CHLORIDE ER 10 MEQ PO TBCR
20.0000 meq | EXTENDED_RELEASE_TABLET | Freq: Every day | ORAL | 2 refills | Status: DC
  Filled 2022-06-05: qty 180, 90d supply, fill #0
  Filled 2022-09-06: qty 180, 90d supply, fill #1
  Filled 2022-12-03: qty 180, 90d supply, fill #2

## 2022-07-15 ENCOUNTER — Other Ambulatory Visit: Payer: Self-pay | Admitting: Family Medicine

## 2022-07-16 ENCOUNTER — Other Ambulatory Visit: Payer: Self-pay

## 2022-07-17 ENCOUNTER — Other Ambulatory Visit (HOSPITAL_COMMUNITY): Payer: Self-pay

## 2022-07-17 MED ORDER — CHLORTHALIDONE 25 MG PO TABS
25.0000 mg | ORAL_TABLET | Freq: Every day | ORAL | 1 refills | Status: DC
  Filled 2022-07-17: qty 90, 90d supply, fill #0
  Filled 2022-10-19: qty 90, 90d supply, fill #1

## 2022-07-18 ENCOUNTER — Other Ambulatory Visit: Payer: Self-pay

## 2022-08-14 ENCOUNTER — Other Ambulatory Visit: Payer: Self-pay

## 2022-09-07 ENCOUNTER — Other Ambulatory Visit (HOSPITAL_COMMUNITY): Payer: Self-pay

## 2022-10-19 ENCOUNTER — Other Ambulatory Visit (HOSPITAL_COMMUNITY): Payer: Self-pay

## 2022-10-19 ENCOUNTER — Other Ambulatory Visit: Payer: Self-pay | Admitting: Family Medicine

## 2022-10-19 MED ORDER — LEVOTHYROXINE SODIUM 100 MCG PO TABS
100.0000 ug | ORAL_TABLET | Freq: Every day | ORAL | 1 refills | Status: DC
  Filled 2022-10-19: qty 90, 90d supply, fill #0
  Filled 2023-01-17: qty 90, 90d supply, fill #1

## 2022-10-22 ENCOUNTER — Other Ambulatory Visit (HOSPITAL_COMMUNITY): Payer: Self-pay

## 2022-10-22 ENCOUNTER — Other Ambulatory Visit: Payer: Self-pay

## 2022-10-23 ENCOUNTER — Other Ambulatory Visit: Payer: Self-pay

## 2022-10-29 ENCOUNTER — Ambulatory Visit (INDEPENDENT_AMBULATORY_CARE_PROVIDER_SITE_OTHER): Payer: Medicare Other

## 2022-10-29 DIAGNOSIS — Z23 Encounter for immunization: Secondary | ICD-10-CM

## 2022-11-21 ENCOUNTER — Other Ambulatory Visit: Payer: Self-pay | Admitting: Family Medicine

## 2022-11-21 ENCOUNTER — Other Ambulatory Visit (HOSPITAL_COMMUNITY): Payer: Self-pay

## 2022-11-21 MED ORDER — LISINOPRIL 20 MG PO TABS
20.0000 mg | ORAL_TABLET | Freq: Every day | ORAL | 0 refills | Status: DC
  Filled 2022-11-21: qty 90, 90d supply, fill #0

## 2022-12-03 ENCOUNTER — Other Ambulatory Visit (HOSPITAL_COMMUNITY): Payer: Self-pay

## 2023-01-17 ENCOUNTER — Other Ambulatory Visit: Payer: Self-pay

## 2023-01-17 ENCOUNTER — Other Ambulatory Visit: Payer: Self-pay | Admitting: Family Medicine

## 2023-01-17 ENCOUNTER — Other Ambulatory Visit (HOSPITAL_COMMUNITY): Payer: Self-pay

## 2023-01-17 MED ORDER — CHLORTHALIDONE 25 MG PO TABS
25.0000 mg | ORAL_TABLET | Freq: Every day | ORAL | 1 refills | Status: DC
  Filled 2023-01-17: qty 90, 90d supply, fill #0
  Filled 2023-04-15: qty 90, 90d supply, fill #1

## 2023-02-11 ENCOUNTER — Other Ambulatory Visit: Payer: Self-pay | Admitting: Family Medicine

## 2023-02-11 DIAGNOSIS — Z1231 Encounter for screening mammogram for malignant neoplasm of breast: Secondary | ICD-10-CM

## 2023-02-26 ENCOUNTER — Other Ambulatory Visit: Payer: Self-pay | Admitting: Family Medicine

## 2023-02-26 ENCOUNTER — Other Ambulatory Visit (HOSPITAL_COMMUNITY): Payer: Self-pay

## 2023-02-27 ENCOUNTER — Other Ambulatory Visit (HOSPITAL_COMMUNITY): Payer: Self-pay

## 2023-02-27 ENCOUNTER — Other Ambulatory Visit: Payer: Self-pay

## 2023-02-27 MED ORDER — LISINOPRIL 20 MG PO TABS
20.0000 mg | ORAL_TABLET | Freq: Every day | ORAL | 0 refills | Status: DC
  Filled 2023-02-27: qty 90, 90d supply, fill #0

## 2023-03-04 ENCOUNTER — Other Ambulatory Visit: Payer: Self-pay | Admitting: Family Medicine

## 2023-03-05 ENCOUNTER — Other Ambulatory Visit (HOSPITAL_COMMUNITY): Payer: Self-pay

## 2023-03-06 ENCOUNTER — Other Ambulatory Visit: Payer: Self-pay

## 2023-03-06 MED ORDER — POTASSIUM CHLORIDE ER 10 MEQ PO TBCR
20.0000 meq | EXTENDED_RELEASE_TABLET | Freq: Every day | ORAL | 0 refills | Status: DC
  Filled 2023-03-06: qty 180, 90d supply, fill #0

## 2023-03-22 ENCOUNTER — Ambulatory Visit: Payer: Medicare Other

## 2023-04-03 IMAGING — MG MM DIGITAL SCREENING BILAT W/ TOMO AND CAD
8 series · 8 of 24 positions shown · non-contrast
Comparison: Previous exam(s).

CLINICAL DATA: Screening.

EXAM:
DIGITAL SCREENING BILATERAL MAMMOGRAM WITH TOMOSYNTHESIS AND CAD
TECHNIQUE: Bilateral screening digital craniocaudal and mediolateral oblique
mammograms were obtained. Bilateral screening digital breast
tomosynthesis was performed. The images were evaluated with
computer-aided detection.

[L MLO synth-2D]
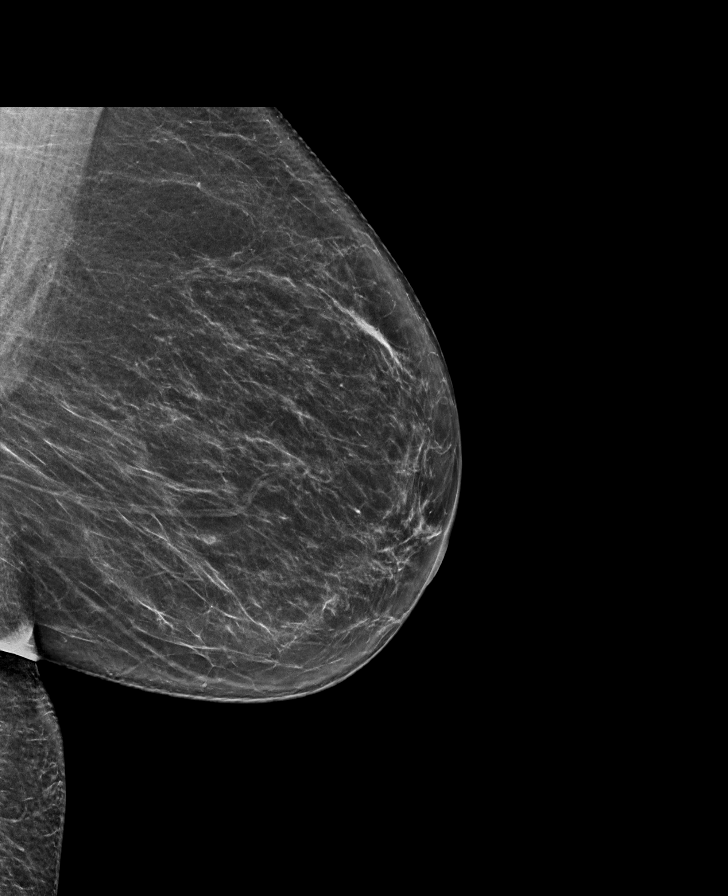

[R CC synth-2D]
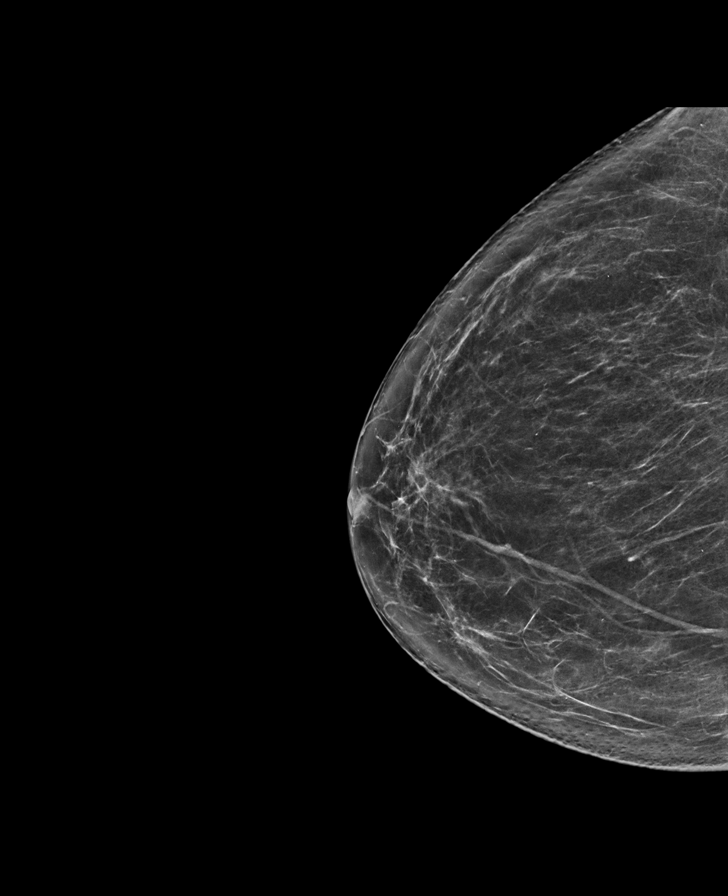

[L CC synth-2D]
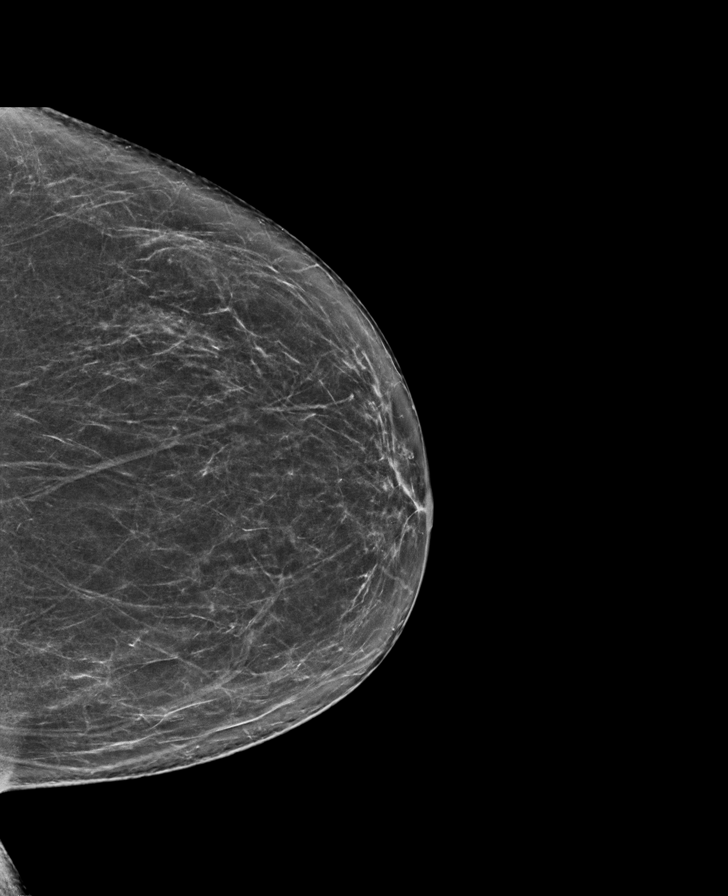

[R MLO synth-2D]
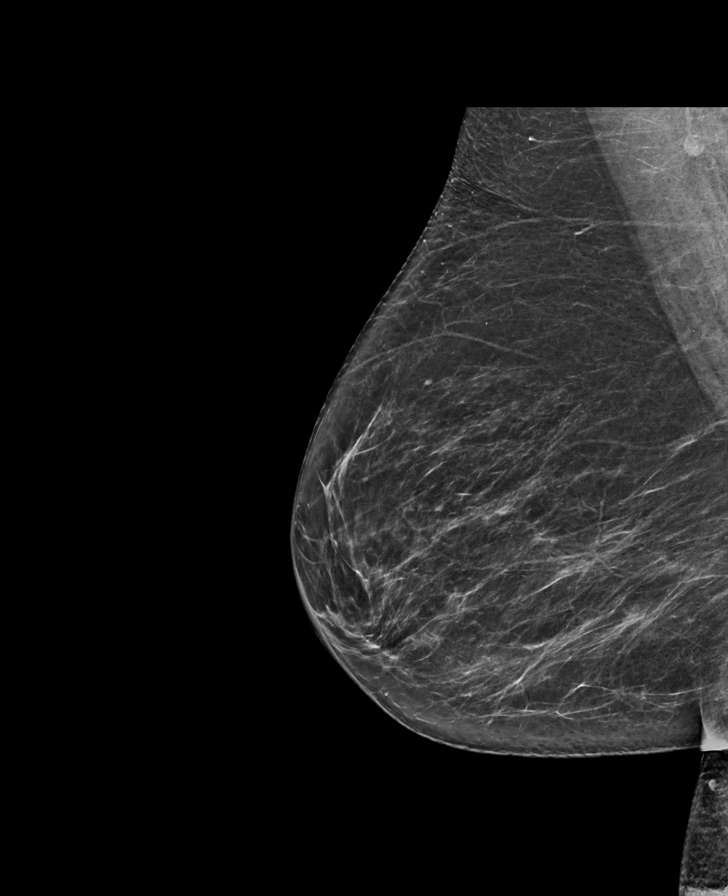

[L MLO tomo · tomo slice 37/74.0]
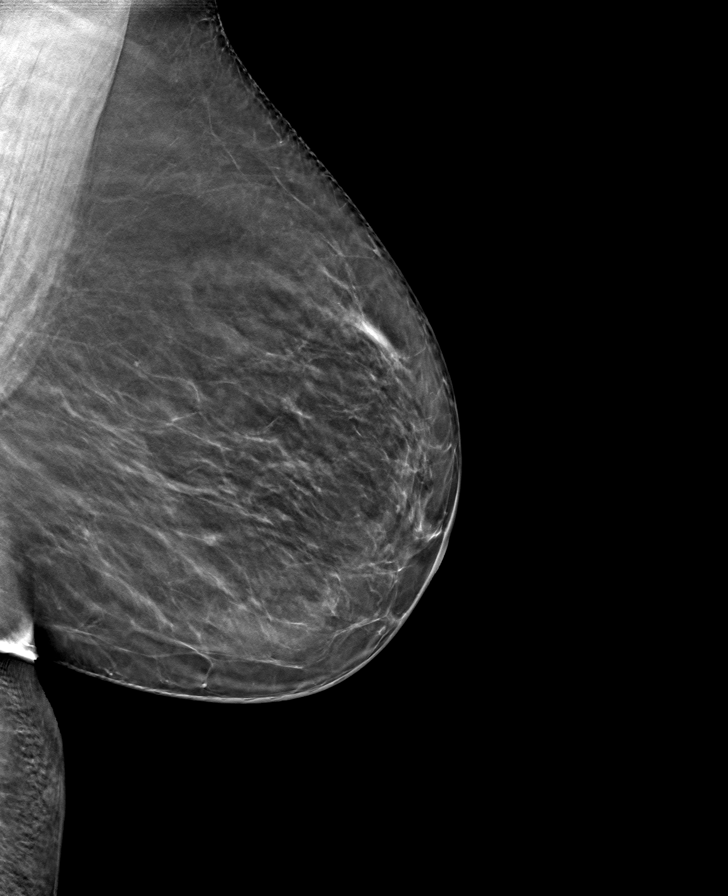

[R MLO tomo · tomo slice 39/77.0]
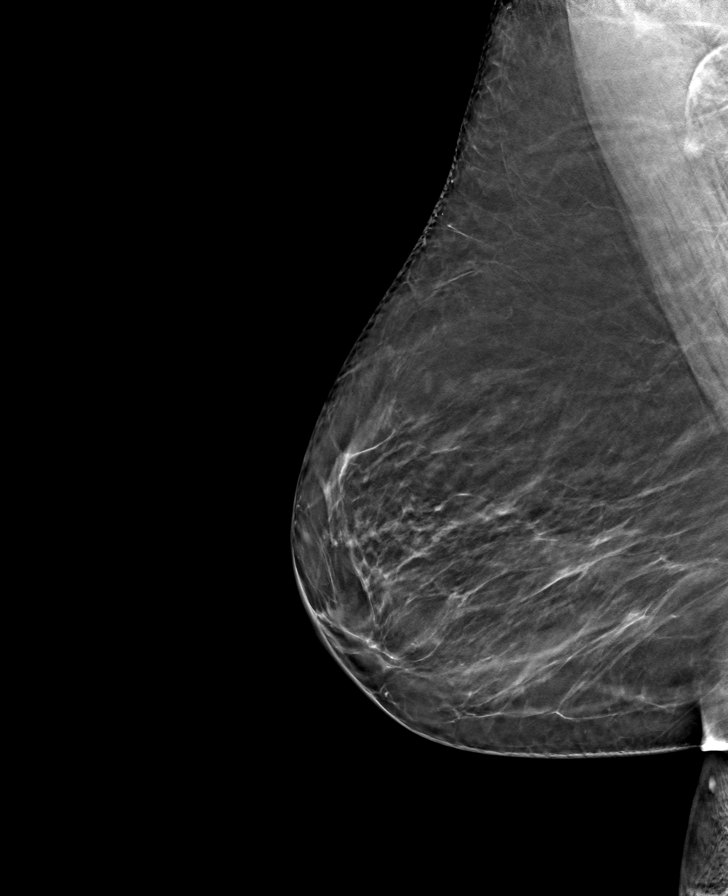

[R CC tomo · tomo slice 37/74.0]
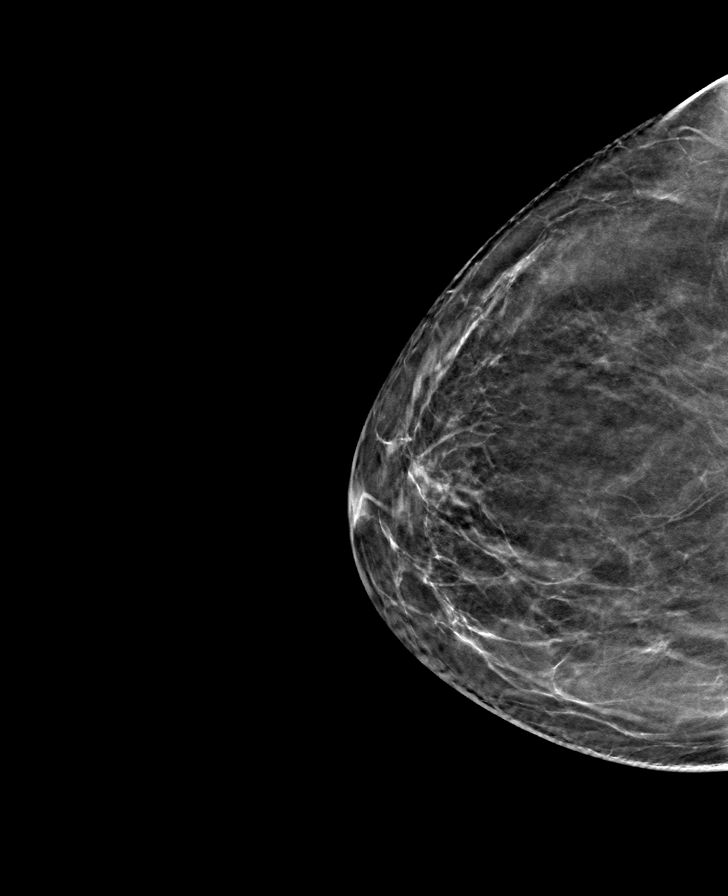

[L CC tomo · tomo slice 36/71.0]
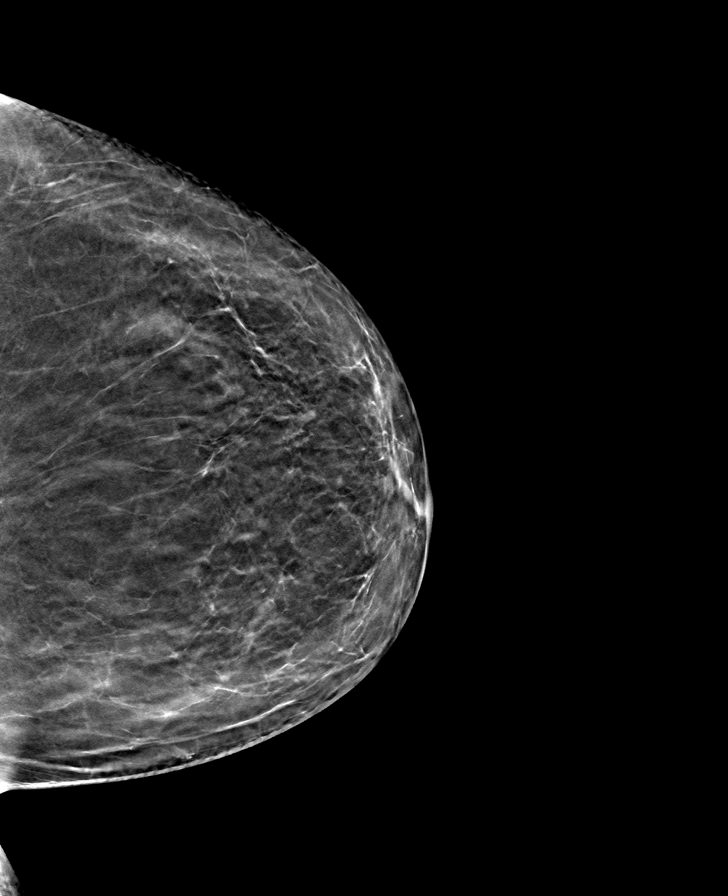

[8 of 24 positions shown; findings below may reference images not displayed]

ACR Breast Density Category b: There are scattered areas of
fibroglandular density.
FINDINGS: There are no findings suspicious for malignancy.
IMPRESSION: No mammographic evidence of malignancy. A result letter of this
screening mammogram will be mailed directly to the patient.

RECOMMENDATION:
Screening mammogram in one year. (Code:51-O-LD2)

BI-RADS CATEGORY  1: Negative.

## 2023-04-15 ENCOUNTER — Other Ambulatory Visit: Payer: Self-pay

## 2023-04-15 ENCOUNTER — Other Ambulatory Visit: Payer: Self-pay | Admitting: Family Medicine

## 2023-04-15 ENCOUNTER — Other Ambulatory Visit (HOSPITAL_COMMUNITY): Payer: Self-pay

## 2023-04-15 MED ORDER — LEVOTHYROXINE SODIUM 100 MCG PO TABS
100.0000 ug | ORAL_TABLET | Freq: Every day | ORAL | 0 refills | Status: DC
  Filled 2023-04-15: qty 90, 90d supply, fill #0

## 2023-04-16 ENCOUNTER — Other Ambulatory Visit (HOSPITAL_COMMUNITY): Payer: Self-pay

## 2023-04-17 ENCOUNTER — Telehealth: Payer: Self-pay | Admitting: Family Medicine

## 2023-04-17 NOTE — Telephone Encounter (Signed)
 I spoke with patient to schedule her AWV.  She stated she was billed over $400 for  her 05/29/22 AWV with Kriste Basque.   She stated she paid it but fought about and she received some of her money back  Can you look at this for patient

## 2023-04-19 ENCOUNTER — Ambulatory Visit
Admission: RE | Admit: 2023-04-19 | Discharge: 2023-04-19 | Disposition: A | Discharge status: Home / Self Care | Payer: Medicare Other | Source: Ambulatory Visit | Attending: Family Medicine | Admitting: Family Medicine

## 2023-04-19 DIAGNOSIS — Z1231 Encounter for screening mammogram for malignant neoplasm of breast: Secondary | ICD-10-CM

## 2023-05-23 ENCOUNTER — Other Ambulatory Visit: Payer: Self-pay | Admitting: Family Medicine

## 2023-05-24 ENCOUNTER — Other Ambulatory Visit: Payer: Self-pay

## 2023-05-24 ENCOUNTER — Other Ambulatory Visit (HOSPITAL_COMMUNITY): Payer: Self-pay

## 2023-05-24 MED ORDER — LISINOPRIL 20 MG PO TABS
20.0000 mg | ORAL_TABLET | Freq: Every day | ORAL | 0 refills | Status: DC
  Filled 2023-05-24: qty 30, 30d supply, fill #0

## 2023-05-27 ENCOUNTER — Encounter: Payer: Self-pay | Admitting: Family Medicine

## 2023-05-27 ENCOUNTER — Ambulatory Visit (INDEPENDENT_AMBULATORY_CARE_PROVIDER_SITE_OTHER): Admitting: Family Medicine

## 2023-05-27 VITALS — BP 124/80 | HR 71 | Temp 97.8°F | Ht 59.84 in | Wt 136.4 lb

## 2023-05-27 DIAGNOSIS — E785 Hyperlipidemia, unspecified: Secondary | ICD-10-CM

## 2023-05-27 DIAGNOSIS — E039 Hypothyroidism, unspecified: Secondary | ICD-10-CM | POA: Diagnosis not present

## 2023-05-27 DIAGNOSIS — I10 Essential (primary) hypertension: Secondary | ICD-10-CM | POA: Diagnosis not present

## 2023-05-27 LAB — COMPREHENSIVE METABOLIC PANEL WITH GFR
ALT: 15 U/L (ref 0–35)
AST: 21 U/L (ref 0–37)
Albumin: 4.1 g/dL (ref 3.5–5.2)
Alkaline Phosphatase: 65 U/L (ref 39–117)
BUN: 16 mg/dL (ref 6–23)
CO2: 31 meq/L (ref 19–32)
Calcium: 9.9 mg/dL (ref 8.4–10.5)
Chloride: 101 meq/L (ref 96–112)
Creatinine, Ser: 1.14 mg/dL (ref 0.40–1.20)
GFR: 49.28 mL/min — ABNORMAL LOW (ref >60.00)
Glucose, Bld: 94 mg/dL (ref 70–99)
Potassium: 4.5 meq/L (ref 3.5–5.1)
Sodium: 139 meq/L (ref 135–145)
Total Bilirubin: 0.5 mg/dL (ref 0.2–1.2)
Total Protein: 6.5 g/dL (ref 6.0–8.3)

## 2023-05-27 LAB — LIPID PANEL
Cholesterol: 202 mg/dL — ABNORMAL HIGH (ref 0–200)
HDL: 52.7 mg/dL (ref >39.00)
LDL Cholesterol: 130 mg/dL — ABNORMAL HIGH (ref 0–99)
NonHDL: 149.44
Total CHOL/HDL Ratio: 4
Triglycerides: 98 mg/dL (ref 0.0–149.0)
VLDL: 19.6 mg/dL (ref 0.0–40.0)

## 2023-05-27 LAB — TSH: TSH: 0.58 u[IU]/mL (ref 0.35–5.50)

## 2023-05-27 NOTE — Progress Notes (Signed)
 Established Patient Office Visit  Subjective   Patient ID: Brianna Jackson, female    DOB: Jan 24, 1956  Age: 68 y.o. MRN: [id number]  Chief Complaint  Patient presents with   Annual Exam    HPI   Brianna Jackson has history of hypertension, VSD, hypothyroidism.  Here today for medical follow-up.  She has mildly elevated LDL cholesterol but had CT coronary morphology study 2021 with coronary calcium score of 0.  Denies any recent chest pains or dizziness.  Her current medications include chlorthalidone  25 mg daily, lisinopril  20 mg daily, levothyroxine  100 mcg daily, and K-Lor 20 mill equivalents 2 tablets daily.  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  She has had some mild concerns regarding memory mostly with remembering specific names.  Has not seen any other evidence for cognitive impairment.  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 She has never used smokeless tobacco. She reports current alcohol use. She reports that she does not use drugs. family history includes Alcohol abuse in her father; Cancer in her mother; Cirrhosis in her father. Allergies  Allergen Reactions   Other Hives    Nuts    Wheat     Gluten intollerance    Review of Systems  Constitutional:  Negative for malaise/fatigue.  Eyes:  Negative for blurred vision.   Respiratory:  Negative for shortness of breath.   Cardiovascular:  Negative for chest pain.  Neurological:  Negative for dizziness, weakness and headaches.      Objective:     BP 124/80 (BP Location: Left Arm, Patient Position: Sitting, Cuff Size: Normal)   Pulse 71   Temp 97.8 F (36.6 C) (Oral)   Ht 4' 11.84" (1.52 m)   Wt 136 lb 6.4 oz (61.9 kg)   SpO2 97%   BMI 26.78 kg/m  BP Readings from Last 3 Encounters:  05/27/23 124/80  05/08/22 100/60  03/23/22 114/74   Wt Readings from Last 3 Encounters:  05/27/23 136 lb 6.4 oz (61.9 kg)  05/08/22 133 lb 3.2 oz (60.4 kg)  03/23/22 132 lb 3.2 oz (60 kg)      Physical Exam Vitals reviewed.  Constitutional:      General: She is not in acute distress.    Appearance: She is well-developed. She is not ill-appearing.  Eyes:     Pupils: Pupils are equal, round, and reactive to light.  Neck:     Thyroid : No thyromegaly.     Vascular: No JVD.  Cardiovascular:     Rate and Rhythm: Normal rate and regular rhythm.     Heart sounds:     No gallop.  Pulmonary:  Effort: Pulmonary effort is normal. No respiratory distress.     Breath sounds: Normal breath sounds. No wheezing or rales.  Musculoskeletal:     Cervical back: Neck supple.     Right lower leg: No edema.     Left lower leg: No edema.  Neurological:     Mental Status: She is alert.      Results for orders placed or performed in visit on 05/27/23  Lipid panel  Result Value Ref Range   Cholesterol 202 (H) 0 - 200 mg/dL   Triglycerides 78.2 0.0 - 149.0 mg/dL   HDL 95.62 >13.08 mg/dL   VLDL 65.7 0.0 - 84.6 mg/dL   LDL Cholesterol 962 (H) 0 - 99 mg/dL   Total CHOL/HDL Ratio 4    NonHDL 149.44   TSH  Result Value Ref Range   TSH 0.58 0.35 - 5.50 uIU/mL  CMP  Result Value Ref Range   Sodium 139 135 - 145 mEq/L   Potassium 4.5 3.5 - 5.1 mEq/L   Chloride 101 96 - 112 mEq/L   CO2 31 19 - 32 mEq/L   Glucose, Bld 94 70 - 99 mg/dL   BUN 16 6 - 23 mg/dL    Creatinine, Ser 9.52 0.40 - 1.20 mg/dL   Total Bilirubin 0.5 0.2 - 1.2 mg/dL   Alkaline Phosphatase 65 39 - 117 U/L   AST 21 0 - 37 U/L   ALT 15 0 - 35 U/L   Total Protein 6.5 6.0 - 8.3 g/dL   Albumin 4.1 3.5 - 5.2 g/dL   GFR 84.13 (L) >24.40 mL/min   Calcium 9.9 8.4 - 10.5 mg/dL      The 10-UVOZ ASCVD risk score (Arnett DK, et al., 2019) is: 10.8%    Assessment & Plan:   Problem List Items Addressed This Visit       Unprioritized   Hypothyroid   Relevant Orders   TSH (Completed)   Hypertension - Primary   Other Visit Diagnoses       Hyperlipidemia, unspecified hyperlipidemia type       Relevant Orders   CMP (Completed)   Lipid panel (Completed)     Chronic medical problems as above.  Recheck labs today including CMP, lipid panel, TSH.  Blood pressure currently well-controlled.  Continue low-sodium diet.  Continue regular exercise such as walking.  Repeat colonoscopy due 2027.  Immunizations up-to-date.  Continue annual flu vaccine  No follow-ups on file.    Brianna Lamy, MD

## 2023-06-04 ENCOUNTER — Other Ambulatory Visit: Payer: Self-pay | Admitting: Family Medicine

## 2023-06-05 ENCOUNTER — Other Ambulatory Visit (HOSPITAL_COMMUNITY): Payer: Self-pay

## 2023-06-05 ENCOUNTER — Other Ambulatory Visit: Payer: Self-pay

## 2023-06-05 MED ORDER — POTASSIUM CHLORIDE ER 10 MEQ PO TBCR
20.0000 meq | EXTENDED_RELEASE_TABLET | Freq: Every day | ORAL | 0 refills | Status: DC
  Filled 2023-06-05: qty 180, 90d supply, fill #0

## 2023-06-21 ENCOUNTER — Telehealth: Payer: Self-pay | Admitting: Family Medicine

## 2023-06-21 ENCOUNTER — Other Ambulatory Visit: Payer: Self-pay | Admitting: Family Medicine

## 2023-06-21 ENCOUNTER — Other Ambulatory Visit (HOSPITAL_COMMUNITY): Payer: Self-pay

## 2023-06-21 ENCOUNTER — Other Ambulatory Visit: Payer: Self-pay

## 2023-06-21 MED ORDER — LISINOPRIL 20 MG PO TABS
20.0000 mg | ORAL_TABLET | Freq: Every day | ORAL | 1 refills | Status: DC
  Filled 2023-06-21: qty 90, 90d supply, fill #0
  Filled 2023-10-01: qty 90, 90d supply, fill #1

## 2023-06-21 NOTE — Telephone Encounter (Signed)
 Noted

## 2023-06-21 NOTE — Telephone Encounter (Signed)
 LVM for pt to let her know that she will be receiving a refund of $320.75 within the next 30 days for her phys that was charged on 05/08/23.   If she has any questions she can call billing: [phone]  [facility]

## 2023-06-28 ENCOUNTER — Ambulatory Visit: Payer: Self-pay

## 2023-06-28 NOTE — Telephone Encounter (Signed)
 Copied from CRM [id number]. Topic: Clinical - Red Word Triage >> Jun 28, 2023  9:11 AM Brianna Jackson H wrote: [facility] that prompted transfer to Nurse Triage: Patient called reporting 3-4 days of chest congestion not nasal. Patient also reports coughing up mucus.   Chief Complaint: Chest Congestion Symptoms: Congestion, Cough Frequency: A couple of days Pertinent Negatives: Patient denies chest pain, fever, or  Disposition: [] ED /[x] Urgent Care (no appt availability in office) / [] Appointment(In office/virtual)/ []  [facility]/ [] Home Care/ [] Refused Recommended Disposition /[] [facility]/ []  Follow-up with PCP Additional Notes: GD is being triaged for chest congestion that has been going on for a couple of days. Recommended the patient see her PCP on Monday or go to the nearest Urgent Care. Patient agreed to disposition and verbalized understanding.   Reason for Disposition  [1] Continuous (nonstop) coughing interferes with work or school AND [2] no improvement using cough treatment per Care Advice  Answer Assessment - Initial Assessment Questions 1. ONSET: "When did the cough begin?"      A couple of days  2. SEVERITY: "How bad is the cough today?"      Intermittent  3. SPUTUM: "Describe the color of your sputum" (none, dry cough; clear, white, yellow, green)     Clear  4. HEMOPTYSIS: "Are you coughing up any blood?" If so ask: "How much?" (flecks, streaks, tablespoons, etc.)     No  5. DIFFICULTY BREATHING: "Are you having difficulty breathing?" If Yes, ask: "How bad is it?" (e.g., mild, moderate, severe)    - MILD: No SOB at rest, mild SOB with walking, speaks normally in sentences, can lie down, no retractions, pulse < 100.    - MODERATE: SOB at rest, SOB with minimal exertion and prefers to sit, cannot lie down flat, speaks in phrases, mild retractions, audible wheezing, pulse 100-120.    - SEVERE: Very SOB at rest, speaks in single words, struggling to breathe,  sitting hunched forward, retractions, pulse > 120      None to Mild  6. FEVER: "Do you have a fever?" If Yes, ask: "What is your temperature, how was it measured, and when did it start?"     No  7. CARDIAC HISTORY: "Do you have any history of heart disease?" (e.g., heart attack, congestive heart failure)       Ventricular Defect  8. LUNG HISTORY: "Do you have any history of lung disease?"  (e.g., pulmonary embolus, asthma, emphysema)     No  9. PE RISK FACTORS: "Do you have a history of blood clots?" (or: recent major surgery, recent prolonged travel, bedridden)     No 10. OTHER SYMPTOMS: "Do you have any other symptoms?" (e.g., runny nose, wheezing, chest pain)       Congestion  11. PREGNANCY: "Is there any chance you are pregnant?" "When was your last menstrual period?"       No and No  12. TRAVEL: "Have you traveled out of the country in the last month?" (e.g., travel history, exposures)       No  Protocols used: Cough - Acute Productive-A-AH

## 2023-07-01 ENCOUNTER — Encounter: Payer: Self-pay | Admitting: Family Medicine

## 2023-07-01 ENCOUNTER — Other Ambulatory Visit (HOSPITAL_COMMUNITY): Payer: Self-pay

## 2023-07-01 ENCOUNTER — Other Ambulatory Visit: Payer: Self-pay

## 2023-07-01 ENCOUNTER — Ambulatory Visit (INDEPENDENT_AMBULATORY_CARE_PROVIDER_SITE_OTHER): Admitting: Family Medicine

## 2023-07-01 VITALS — BP 118/72 | HR 86 | Temp 98.7°F | Wt 133.7 lb

## 2023-07-01 DIAGNOSIS — J209 Acute bronchitis, unspecified: Secondary | ICD-10-CM

## 2023-07-01 MED ORDER — ALBUTEROL SULFATE HFA 108 (90 BASE) MCG/ACT IN AERS
1.0000 | INHALATION_SPRAY | RESPIRATORY_TRACT | 2 refills | Status: AC | PRN
  Filled 2023-07-01: qty 6.7, 16d supply, fill #0
  Filled 2023-07-01: qty 6.7, 20d supply, fill #0

## 2023-07-01 MED ORDER — PREDNISONE 20 MG PO TABS
ORAL_TABLET | ORAL | 0 refills | Status: AC
  Filled 2023-07-01 (×2): qty 10, 5d supply, fill #0

## 2023-07-01 MED ORDER — BENZONATATE 100 MG PO CAPS
100.0000 mg | ORAL_CAPSULE | Freq: Three times a day (TID) | ORAL | 0 refills | Status: AC | PRN
  Filled 2023-07-01 (×2): qty 30, 10d supply, fill #0

## 2023-07-01 NOTE — Progress Notes (Signed)
 Established Patient Office Visit  Subjective   Patient ID: Brianna Jackson, female    DOB: 1955/03/31  Age: 68 y.o. MRN: [id number]  Chief Complaint  Patient presents with   Cough    Patient complains of productive cough, x3 days, Tried Mucinex     HPI   Deby is seen as a work in today with onset last Friday rather acutely of cough.  She went to urgent care and was advised to take over-the-counter medications including Mucinex.  COVID testing was negative.  There was mention of her having chest x-ray but she recalls not having any chest x-ray.  She has had some continued cough mostly productive of clear sputum.  No fever or chills.  No dyspnea.  No history of smoking.  She does feel like she is having some mild intermittent wheezing.  Has used albuterol  in the past but needs refill.  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 She has never used smokeless tobacco. She reports current alcohol use. She reports that she does not use drugs. family history includes Alcohol abuse in her father; Cancer in her mother; Cirrhosis in her father. Allergies  Allergen Reactions   Other Hives    Nuts    Wheat     Gluten intollerance    Review of Systems  Constitutional:  Negative for chills and fever.  Respiratory:  Positive for cough and wheezing. Negative for hemoptysis.        Objective:     BP 118/72 (BP Location: Left Arm, Patient Position: Sitting, Cuff Size: Normal)   Pulse 86   Temp 98.7 F (37.1 C) (Oral)   Wt 133 lb 11.2 oz (60.6 kg)   SpO2 96%   BMI 26.25 kg/m    Physical Exam Vitals reviewed.  Constitutional:      General: She is not in acute distress.    Appearance: She is not ill-appearing.  Cardiovascular:     Rate and Rhythm: Normal rate and regular rhythm.  Pulmonary:     Comments: Only few faint expiratory wheezes lower lung field.  No rales.  No retractions.  O2 sat 96% room air Musculoskeletal:     Cervical back: Neck supple.  Lymphadenopathy:     Cervical: No cervical adenopathy.  Neurological:     Mental Status: She is alert.      No results found for any visits on 07/01/23.    The 10-year ASCVD risk score (Arnett DK, et al., 2019) is: 9.8%    Assessment & Plan:   Acute cough with mild reactive airway component.  Probable viral trigger.  No fever or other findings to suggest likely pneumonia at  this time.  -Refill albuterol  MDI to use every 4 hours as needed for cough/wheeze - Prednisone  20 mg 2 tablets daily for 5 days - Tessalon Perles 100 mg every 8 hours as needed for cough - Follow-up immediately for any fever, increased shortness of breath, or other concerns.  We did discuss that acute bronchitis frequently takes 3 or 4 weeks to fully resolve    Glean Lamy, MD

## 2023-07-01 NOTE — Telephone Encounter (Signed)
 Patient reported on-going productive cough and f/u has been scheduled

## 2023-07-14 ENCOUNTER — Other Ambulatory Visit: Payer: Self-pay | Admitting: Family Medicine

## 2023-07-15 ENCOUNTER — Other Ambulatory Visit (HOSPITAL_COMMUNITY): Payer: Self-pay

## 2023-07-15 ENCOUNTER — Other Ambulatory Visit: Payer: Self-pay

## 2023-07-15 MED ORDER — LEVOTHYROXINE SODIUM 100 MCG PO TABS
100.0000 ug | ORAL_TABLET | Freq: Every day | ORAL | 0 refills | Status: DC
  Filled 2023-07-15: qty 90, 90d supply, fill #0

## 2023-07-15 MED ORDER — CHLORTHALIDONE 25 MG PO TABS
25.0000 mg | ORAL_TABLET | Freq: Every day | ORAL | 1 refills | Status: DC
  Filled 2023-07-15: qty 90, 90d supply, fill #0
  Filled 2023-10-15: qty 90, 90d supply, fill #1

## 2023-08-27 ENCOUNTER — Other Ambulatory Visit: Payer: Self-pay | Admitting: Family Medicine

## 2023-08-28 ENCOUNTER — Other Ambulatory Visit (HOSPITAL_COMMUNITY): Payer: Self-pay

## 2023-08-28 ENCOUNTER — Other Ambulatory Visit: Payer: Self-pay

## 2023-08-28 MED ORDER — POTASSIUM CHLORIDE ER 10 MEQ PO TBCR
20.0000 meq | EXTENDED_RELEASE_TABLET | Freq: Every day | ORAL | 0 refills | Status: DC
  Filled 2023-08-28: qty 180, 90d supply, fill #0

## 2023-10-01 ENCOUNTER — Other Ambulatory Visit (HOSPITAL_COMMUNITY): Payer: Self-pay

## 2023-10-15 ENCOUNTER — Other Ambulatory Visit (HOSPITAL_COMMUNITY): Payer: Self-pay

## 2023-10-15 ENCOUNTER — Other Ambulatory Visit: Payer: Self-pay | Admitting: Family Medicine

## 2023-10-15 ENCOUNTER — Other Ambulatory Visit: Payer: Self-pay

## 2023-10-16 ENCOUNTER — Other Ambulatory Visit (HOSPITAL_COMMUNITY): Payer: Self-pay

## 2023-10-16 MED ORDER — LEVOTHYROXINE SODIUM 100 MCG PO TABS
100.0000 ug | ORAL_TABLET | Freq: Every day | ORAL | 2 refills | Status: AC
  Filled 2023-10-16: qty 90, 90d supply, fill #0
  Filled 2024-01-09: qty 90, 90d supply, fill #1

## 2023-11-28 ENCOUNTER — Other Ambulatory Visit: Payer: Self-pay | Admitting: Family Medicine

## 2023-11-29 ENCOUNTER — Other Ambulatory Visit (HOSPITAL_COMMUNITY): Payer: Self-pay

## 2023-11-29 MED ORDER — POTASSIUM CHLORIDE ER 10 MEQ PO TBCR
20.0000 meq | EXTENDED_RELEASE_TABLET | Freq: Every day | ORAL | 0 refills | Status: DC
  Filled 2023-11-29: qty 180, 90d supply, fill #0

## 2023-12-24 ENCOUNTER — Other Ambulatory Visit: Payer: Self-pay | Admitting: Family Medicine

## 2023-12-24 DIAGNOSIS — I10 Essential (primary) hypertension: Secondary | ICD-10-CM

## 2023-12-25 ENCOUNTER — Other Ambulatory Visit (HOSPITAL_COMMUNITY): Payer: Self-pay

## 2023-12-25 ENCOUNTER — Other Ambulatory Visit: Payer: Self-pay

## 2023-12-25 MED ORDER — LISINOPRIL 20 MG PO TABS
20.0000 mg | ORAL_TABLET | Freq: Every day | ORAL | 1 refills | Status: AC
  Filled 2023-12-25: qty 90, 90d supply, fill #0

## 2024-01-09 ENCOUNTER — Other Ambulatory Visit: Payer: Self-pay

## 2024-01-09 ENCOUNTER — Other Ambulatory Visit: Payer: Self-pay | Admitting: Family Medicine

## 2024-01-10 ENCOUNTER — Other Ambulatory Visit: Payer: Self-pay

## 2024-01-10 MED ORDER — CHLORTHALIDONE 25 MG PO TABS
25.0000 mg | ORAL_TABLET | Freq: Every day | ORAL | 1 refills | Status: AC
  Filled 2024-01-10: qty 90, 90d supply, fill #0

## 2024-01-15 ENCOUNTER — Other Ambulatory Visit: Payer: Self-pay

## 2024-01-15 ENCOUNTER — Other Ambulatory Visit (HOSPITAL_COMMUNITY): Payer: Self-pay

## 2024-01-17 ENCOUNTER — Other Ambulatory Visit: Payer: Self-pay

## 2024-02-26 ENCOUNTER — Other Ambulatory Visit: Payer: Self-pay

## 2024-02-26 ENCOUNTER — Other Ambulatory Visit (HOSPITAL_COMMUNITY): Payer: Self-pay

## 2024-02-26 ENCOUNTER — Encounter: Payer: Self-pay | Admitting: Pharmacist

## 2024-02-26 ENCOUNTER — Other Ambulatory Visit: Payer: Self-pay | Admitting: Family Medicine

## 2024-02-26 MED ORDER — POTASSIUM CHLORIDE ER 10 MEQ PO TBCR
20.0000 meq | EXTENDED_RELEASE_TABLET | Freq: Every day | ORAL | 0 refills | Status: AC
  Filled 2024-02-26: qty 180, 90d supply, fill #0
